# Patient Record
Sex: Male | Born: 1970
Health system: Southern US, Community
[De-identification: ages and names within clinical notes are randomized; demographics above are authoritative.]

## PROBLEM LIST (undated history)

## (undated) DIAGNOSIS — T7840XA Allergy, unspecified, initial encounter: Secondary | ICD-10-CM

## (undated) DIAGNOSIS — D126 Benign neoplasm of colon, unspecified: Secondary | ICD-10-CM

## (undated) DIAGNOSIS — M1990 Unspecified osteoarthritis, unspecified site: Secondary | ICD-10-CM

## (undated) DIAGNOSIS — K824 Cholesterolosis of gallbladder: Secondary | ICD-10-CM

## (undated) DIAGNOSIS — K219 Gastro-esophageal reflux disease without esophagitis: Secondary | ICD-10-CM

## (undated) DIAGNOSIS — E785 Hyperlipidemia, unspecified: Secondary | ICD-10-CM

## (undated) DIAGNOSIS — E559 Vitamin D deficiency, unspecified: Secondary | ICD-10-CM

## (undated) HISTORY — DX: Unspecified osteoarthritis, unspecified site: M19.90

## (undated) HISTORY — DX: Vitamin D deficiency, unspecified: E55.9

## (undated) HISTORY — DX: Hyperlipidemia, unspecified: E78.5

## (undated) HISTORY — DX: Gastro-esophageal reflux disease without esophagitis: K21.9

## (undated) HISTORY — DX: Allergy, unspecified, initial encounter: T78.40XA

## (undated) HISTORY — DX: Benign neoplasm of colon, unspecified: D12.6

## (undated) HISTORY — DX: Cholesterolosis of gallbladder: K82.4

---

## 2011-02-08 ENCOUNTER — Other Ambulatory Visit: Payer: Self-pay | Admitting: Gastroenterology

## 2011-02-08 DIAGNOSIS — R1013 Epigastric pain: Secondary | ICD-10-CM

## 2011-02-21 ENCOUNTER — Ambulatory Visit
Admission: RE | Admit: 2011-02-21 | Discharge: 2011-02-21 | Disposition: A | Discharge status: Home / Self Care | Payer: 59 | Source: Ambulatory Visit | Attending: Gastroenterology | Admitting: Gastroenterology

## 2011-02-21 DIAGNOSIS — R1013 Epigastric pain: Secondary | ICD-10-CM

## 2011-03-14 ENCOUNTER — Ambulatory Visit (INDEPENDENT_AMBULATORY_CARE_PROVIDER_SITE_OTHER): Payer: 59 | Admitting: General Surgery

## 2011-05-07 ENCOUNTER — Ambulatory Visit (INDEPENDENT_AMBULATORY_CARE_PROVIDER_SITE_OTHER): Payer: 59 | Admitting: General Surgery

## 2011-05-07 ENCOUNTER — Encounter (INDEPENDENT_AMBULATORY_CARE_PROVIDER_SITE_OTHER): Payer: Self-pay | Admitting: General Surgery

## 2011-05-07 VITALS — BP 116/84 | HR 72 | Temp 96.9°F | Resp 12 | Ht 68.0 in | Wt 200.0 lb

## 2011-05-07 DIAGNOSIS — K219 Gastro-esophageal reflux disease without esophagitis: Secondary | ICD-10-CM

## 2011-05-07 DIAGNOSIS — K8020 Calculus of gallbladder without cholecystitis without obstruction: Secondary | ICD-10-CM

## 2011-05-07 NOTE — Progress Notes (Signed)
Chief Complaint  Patient presents with  . Other    new pt- eval gallstones or polyps    HPI Lance Warner is a 40 y.o. male.  Referred by Dr Lance Warner HPI [name] who is otherwise healthy who has had some epigastric abdominal pain and burning chest pain for a number of years. He also complains of some pain in his left orbit at the same time. He has some nausea but no vomiting. He occasionally has some hiccups. He has been seen by gastroenterology. He is on Nexium and this helped most of those symptoms although he occasionally does still have some epigastric pain. He associated epigastric pain with his typical reflux pain but he describes it as a knot in his epigastrium. There is no real relation to any food. This is mostly constant when it does occur. He has undergone an EGD that is normal. He also has undergone an ultrasound that shows several small echogenic foci adherent to the gallbladder wall consistent with polyps or hearing stones. The largest of these measured 1 cm at the gallbladder neck. His common bile duct measured 3 mm. He comes in today to discuss a possible laparoscopic cholecystectomy both for possible symptoms as well as a polyp  Past Medical History  Diagnosis Date  . Hyperlipidemia   . GERD (gastroesophageal reflux disease)     History reviewed. No pertinent past surgical history.  History reviewed. No pertinent family history.  Social History History  Substance Use Topics  . Smoking status: Never Smoker   . Smokeless tobacco: Not on file  . Alcohol Use: No    No Known Allergies  Current Outpatient Prescriptions  Medication Sig Dispense Refill  . Cholecalciferol (VITAMIN D PO) Take 1,200 mg by mouth daily.        Marland Kitchen esomeprazole (NEXIUM) 40 MG capsule Take 40 mg by mouth daily before breakfast.          Review of Systems Review of Systems  Blood pressure 116/84, pulse 72, temperature 96.9 F (36.1 C), temperature source Temporal, resp.  rate 12, height 5\' 8"  (1.727 m), weight 200 lb (90.719 kg).  Physical Exam Physical Exam  Constitutional: He appears well-developed and well-nourished.  Eyes: No scleral icterus.  Neck: Neck supple.  Cardiovascular: Normal rate, regular rhythm and normal heart sounds.   Pulmonary/Chest: Effort normal and breath sounds normal. He has no wheezes. He has no rales.  Abdominal: Soft. Normal appearance and bowel sounds are normal. There is no tenderness. There is negative Murphy's sign. No hernia.  Lymphadenopathy:    He has no cervical adenopathy.    Data Reviewed ABDOMINAL ULTRASOUND COMPLETE  Comparison: None.  Findings:  Gallbladder: There are several small echogenic foci adherent to  the gallbladder wall, consistent with polyps or adherent  cholesterol stones. The largest measures approximately 1 cm at the  gallbladder neck. No gallbladder wall thickening or pericholecystic  fluid. Negative sonographic Murphy's sign.  Common Bile Duct: Within normal limits in caliber. Measures 3 mm.  Liver: No focal mass lesion identified. There is marked, diffuse  fatty infiltration.  IVC: Appears normal.  Pancreas: No abnormality identified.  Spleen: Within normal limits in size and echotexture.  Right kidney: Normal in size and parenchymal echogenicity. No  evidence of mass or hydronephrosis.  Left kidney: Normal in size and parenchymal echogenicity. No  evidence of mass or hydronephrosis.  Abdominal Aorta: No aneurysm identified.  IMPRESSION: Diffuse fatty infiltration of the liver.  There are multiple  small gallbladder polyps versus adherent  cholesterol stones within the gallbladder lumen. No evidence of  cholecystitis, however.   Assessment    Symptomatic cholelithiasis vs polyp    Plan    We discussed his symptoms might mostly be secondary to his reflux but the epigastric pain may be gallbladder related.  There is also a concern for this being a polyp.  I recommended a lap chole  due to his symptoms and the presence of a possible polyp.   I discussed the procedure in detail.  The patient was given Agricultural engineer.  We discussed the risks and benefits of a laparoscopic cholecystectomy and possible cholangiogram including, but not limited to bleeding, infection, injury to surrounding structures such as the intestine or liver, bile leak, retained gallstones, need to convert to an open procedure, prolonged diarrhea, blood clots such as  DVT, common bile duct injury, anesthesia risks, and possible need for additional procedures.  The likelihood of improvement in symptoms and return to the patient's normal status is good. We discussed the typical post-operative recovery course.  He would like to consider his options at this point.  I told him if he doesnt decide to undergo surgery we would need to set up a repeat ultrasound.  We also discussed the risks of choledocholithiasis and gallstone pancreatitis.      Lance Warner 05/07/2011, 10:12 AM

## 2011-06-21 ENCOUNTER — Encounter (INDEPENDENT_AMBULATORY_CARE_PROVIDER_SITE_OTHER): Payer: Self-pay | Admitting: Gastroenterology

## 2011-12-25 ENCOUNTER — Telehealth (INDEPENDENT_AMBULATORY_CARE_PROVIDER_SITE_OTHER): Payer: Self-pay

## 2011-12-25 DIAGNOSIS — R109 Unspecified abdominal pain: Secondary | ICD-10-CM

## 2011-12-25 NOTE — Telephone Encounter (Signed)
Called pt's wife about ordering another abd. U/s.  We will schedule the xray and call with appt time. The pt is requesting an August time to get the xray.

## 2012-01-27 ENCOUNTER — Ambulatory Visit
Admission: RE | Admit: 2012-01-27 | Discharge: 2012-01-27 | Disposition: A | Discharge status: Home / Self Care | Payer: 59 | Source: Ambulatory Visit | Attending: General Surgery | Admitting: General Surgery

## 2012-01-27 DIAGNOSIS — R109 Unspecified abdominal pain: Secondary | ICD-10-CM

## 2012-01-28 ENCOUNTER — Telehealth (INDEPENDENT_AMBULATORY_CARE_PROVIDER_SITE_OTHER): Payer: Self-pay

## 2012-01-28 NOTE — Telephone Encounter (Signed)
Pt's wife returned my call. We scheduled the pt for an appt with Dr Dwain Sarna for 8/16 arrive at 8:30 for 8:45am.

## 2012-01-28 NOTE — Telephone Encounter (Signed)
LMOM at home and cell. I need to make f/u appt for pt with Dr Dwain Sarna.

## 2012-02-07 ENCOUNTER — Encounter (INDEPENDENT_AMBULATORY_CARE_PROVIDER_SITE_OTHER): Payer: Self-pay | Admitting: General Surgery

## 2012-02-07 ENCOUNTER — Ambulatory Visit (INDEPENDENT_AMBULATORY_CARE_PROVIDER_SITE_OTHER): Payer: 59 | Admitting: General Surgery

## 2012-02-07 VITALS — BP 122/76 | HR 68 | Temp 97.3°F | Resp 16 | Ht 68.0 in | Wt 189.0 lb

## 2012-02-07 DIAGNOSIS — K824 Cholesterolosis of gallbladder: Secondary | ICD-10-CM

## 2012-02-07 NOTE — Progress Notes (Signed)
Subjective:     Patient ID: Lance Warner, male   DOB: [birth year], 41 y.o.   MRN: [id number]  HPI This is a 41-year-old male who is otherwise healthy who I saw previously with  epigastric abdominal pain and burning chest pain for a number of years. He has been seen by gastroenterology. He is on Nexium and this helped most of those symptoms although he occasionally does still have some epigastric pain. He associated epigastric pain with his typical reflux pain but he describes it as a knot in his epigastrium. There is no real relation to any food. This is mostly constant when it does occur. He has undergone an EGD that is normal. He also has undergone an ultrasound that shows several small echogenic foci adherent to the gallbladder wall consistent with polyps or\stones. The largest of these measured 1 cm at the gallbladder neck. His common bile duct measured 3 mm.I saw him previously and recommended lap chole.  He wanted to observe at that time.  He returns today for followup after we repeated his ultrasound. On this ultrasound he is a single mobile gallstone measuring 5 mm. The gallbladder wall measures 2 mm. He has 2-3 mm gallbladder polyps. He also has hepatic steatosis. Since I last saw him he has been on his Nexium and he has absolutely no abdominal symptoms.   Review of Systems COMPLETE ABDOMINAL ULTRASOUND  Comparison: 02/21/2011.  Findings:  Gallbladder: There is a single mobile gallstone measuring 5 mm.  No wall thickening or pericholecystic fluid. Gallbladder wall  measures 2 mm. No sonographic Murphy's sign.  Common bile duct: 3 mm, normal.  Liver: Echogenic liver compatible with hepatic steatosis. No  focal mass lesions. No intrahepatic biliary ductal dilation.  IVC: Visualized portions normal. Intrahepatic IVC not visualized  due to overlying bowel gas.  Pancreas: Not visualized due to overlying bowel gas.  Spleen: 4.2 cm with normal echotexture.  Right Kidney: 11 cm. Normal  echotexture. Normal central sinus  echo complex. No calculi or hydronephrosis.  Left Kidney: 10.8 cm. Normal echotexture. Normal central sinus  echo complex. No calculi or hydronephrosis.  Abdominal aorta: No aneurysm identified.  IMPRESSION:  1. Cholelithiasis without cholecystitis. Single mobile echogenic  gallstone measures 5 mm.  2. Two 3 mm gallbladder polyps.  3. Echogenic liver compatible with hepatic steatosis.     Objective:   Physical Exam  Vitals reviewed. Constitutional: He appears well-developed.  Abdominal: Soft. Bowel sounds are normal. He exhibits no distension and no mass. There is no tenderness. There is negative Murphy's sign. No hernia.       Assessment:     Gallbladder polyps    Plan:     We reviewed the ultrasound today. His prior symptoms may have been completely related to his reflux. He has absolutely no symptoms on his Nexium. He does have a single gallstone and 2 very small polyps. I told them it would  be reasonable to observe this for right now. We discussed at length symptoms as well. I told him if he has any abdominal symptoms or anything that might be referable to his gallbladder I certainly would recommend removing his gallbladder but just not sure at this point that the gallbladder is the source of any of the symptoms that he had previously. I told him if he chooses to observe this I will plan on seeing him back in one year and repeat ultrasound just to look at his polyps one more time.

## 2012-06-22 ENCOUNTER — Ambulatory Visit: Payer: 59 | Attending: Family Medicine

## 2012-06-22 DIAGNOSIS — IMO0001 Reserved for inherently not codable concepts without codable children: Secondary | ICD-10-CM | POA: Insufficient documentation

## 2012-06-22 DIAGNOSIS — M546 Pain in thoracic spine: Secondary | ICD-10-CM | POA: Insufficient documentation

## 2012-06-25 ENCOUNTER — Ambulatory Visit: Payer: 59 | Attending: Family Medicine | Admitting: Physical Therapy

## 2012-06-25 DIAGNOSIS — IMO0001 Reserved for inherently not codable concepts without codable children: Secondary | ICD-10-CM | POA: Insufficient documentation

## 2012-06-25 DIAGNOSIS — M546 Pain in thoracic spine: Secondary | ICD-10-CM | POA: Insufficient documentation

## 2012-06-30 ENCOUNTER — Ambulatory Visit: Payer: 59

## 2012-09-09 ENCOUNTER — Other Ambulatory Visit: Payer: Self-pay | Admitting: *Deleted

## 2012-09-10 MED ORDER — KETOCONAZOLE 2 % EX CREA: TOPICAL_CREAM | CUTANEOUS | Status: DC

## 2012-09-10 NOTE — Telephone Encounter (Signed)
refilled 

## 2012-12-16 ENCOUNTER — Encounter (INDEPENDENT_AMBULATORY_CARE_PROVIDER_SITE_OTHER): Payer: Self-pay | Admitting: General Surgery

## 2013-01-26 ENCOUNTER — Telehealth (INDEPENDENT_AMBULATORY_CARE_PROVIDER_SITE_OTHER): Payer: Self-pay | Admitting: General Surgery

## 2013-01-26 DIAGNOSIS — K824 Cholesterolosis of gallbladder: Secondary | ICD-10-CM

## 2013-01-26 NOTE — Telephone Encounter (Signed)
Spoke with pt's wife and explained that I had her husband set up with [facility] at [address] on 02/03/13 @ 8:30.  Informed her that he must be NPO after midnight.  Also gave him the f/u appt w/ Dr. Dwain Sarna on 02/08/13 at 11:45 w/ arrival time of 11:30.

## 2013-01-26 NOTE — Telephone Encounter (Signed)
Message copied by Littie Deeds on Tue Jan 26, 2013  2:06 PM ------      Message from: [place], [place]      Created: Tue Jan 26, 2013  1:54 PM       That is fine.            ----- Message -----         From: Littie Deeds         Sent: 01/26/2013  10:04 AM           To: Emelia Loron, MD            Pt called in explaining that they need a LTF appt w/ you for GB.  They insisted on having another [place] before the appt.  Your noet says that you will get one on him but I wanted to get the okay from you to have them do this before their appt.            Penni Bombard       ------

## 2013-02-03 ENCOUNTER — Ambulatory Visit
Admission: RE | Admit: 2013-02-03 | Discharge: 2013-02-03 | Disposition: A | Discharge status: Home / Self Care | Payer: 59 | Source: Ambulatory Visit | Attending: General Surgery | Admitting: General Surgery

## 2013-02-03 DIAGNOSIS — K824 Cholesterolosis of gallbladder: Secondary | ICD-10-CM

## 2013-02-08 ENCOUNTER — Ambulatory Visit (INDEPENDENT_AMBULATORY_CARE_PROVIDER_SITE_OTHER): Payer: 59 | Admitting: General Surgery

## 2013-02-08 ENCOUNTER — Encounter (INDEPENDENT_AMBULATORY_CARE_PROVIDER_SITE_OTHER): Payer: Self-pay | Admitting: General Surgery

## 2013-02-08 VITALS — BP 112/64 | HR 64 | Temp 96.1°F | Resp 14 | Ht 68.0 in | Wt 194.2 lb

## 2013-02-08 DIAGNOSIS — K824 Cholesterolosis of gallbladder: Secondary | ICD-10-CM

## 2013-02-08 NOTE — Patient Instructions (Signed)

## 2013-02-11 NOTE — Progress Notes (Signed)
Subjective:     Patient ID: Lance Warner, male   DOB: [birth year], 42 y.o.   MRN: [id number]  HPI This is a 42 year-old male who is otherwise healthy who I saw previously with epigastric abdominal pain and burning chest pain for a number of years.  He is on Nexium which he takes as needed.  This completely takes care of any symptoms he has.  These are rare now and associated with eating fatty foods like pizza. He otherwise describes no abdominal pain, nausea or any other GI symptoms. He has undergone an EGD that is normal. I previously thought some symptoms might be related to gallstones and had discussed lap chole.  I think now that he really does not have any gb symptoms and it pretty clearly is associated with his reflux which is better now.  He really has no complaints today.  He has undergone another u/s to follow polyps and returns today to discuss this.     Review of Systems COMPLETE ABDOMINAL ULTRASOUND  Comparison: 01/27/2012  Findings:  Gallbladder: Mobile 5 mm echogenic focus within the gallbladder.  No definite posterior acoustic shadowing.  2 non mobile 3 mm gallbladder polyps. No wall thickening or  pericholecystic fluid. Sonographic Murphy's sign was not elicited.  Common bile duct: Normal, 3 mm.  Liver: Mildly heterogeneous and hyperechoic, consistent with  steatosis.  IVC: Partially obscured by bowel gas.  Pancreas: Poorly visualized due to overlying bowel gas.  Spleen: Normal in size and echogenicity.  Right Kidney: 11.0 cm. No hydronephrosis.  Left Kidney: 10.8 cm. No hydronephrosis.  Abdominal aorta: Nonaneurysmal without ascites.  IMPRESSION:  1. Cholelithiasis without cholecystitis.  2. Similar appearance of 2 gallbladder polyps.  3. Hepatic steatosis.  4. Portions of anatomy obscured by overlying bowel gas.      Objective:   Physical Exam  Vitals reviewed. Constitutional: He appears well-developed and well-nourished.  Abdominal: Normal appearance and bowel  sounds are normal. He exhibits no distension. There is no tenderness. There is negative Murphy's sign.       Assessment:     GERD Gallstone, 3 mm polyps (asymptomatic)    Plan:     We reviewed the ultrasound today again. There have been no concerning changes.  The polyps are still small and unchanged. His prior symptoms may have been completely related to his reflux. He has absolutely no symptoms on his Nexium that he uses PRN. He does have a single gallstone and 2 very small polyps. I told them it would be reasonable to observe this for right now. We discussed at length symptoms as well. I told him if he has any abdominal symptoms or anything that might be referable to his gallbladder I certainly would recommend removing his gallbladder but just not sure at this point that the gallbladder is the source of any of the symptoms that he had previously. I told him at this point to call me back if develops any symptoms.

## 2013-06-15 ENCOUNTER — Encounter (INDEPENDENT_AMBULATORY_CARE_PROVIDER_SITE_OTHER): Payer: Self-pay

## 2013-06-15 ENCOUNTER — Encounter (INDEPENDENT_AMBULATORY_CARE_PROVIDER_SITE_OTHER): Payer: 59 | Admitting: Family Medicine

## 2013-06-15 NOTE — Progress Notes (Signed)
Patient ID:  Rescheduled. Not seen

## 2013-07-05 ENCOUNTER — Encounter: Payer: Self-pay | Admitting: Family Medicine

## 2013-07-05 ENCOUNTER — Ambulatory Visit (INDEPENDENT_AMBULATORY_CARE_PROVIDER_SITE_OTHER): Payer: 59 | Admitting: Family Medicine

## 2013-07-05 VITALS — BP 115/80 | HR 68 | Temp 97.3°F | Ht 67.5 in | Wt 193.4 lb

## 2013-07-05 DIAGNOSIS — M1990 Unspecified osteoarthritis, unspecified site: Secondary | ICD-10-CM | POA: Insufficient documentation

## 2013-07-05 DIAGNOSIS — K219 Gastro-esophageal reflux disease without esophagitis: Secondary | ICD-10-CM

## 2013-07-05 DIAGNOSIS — Z125 Encounter for screening for malignant neoplasm of prostate: Secondary | ICD-10-CM

## 2013-07-05 DIAGNOSIS — Z0000 Encounter for general adult medical examination without abnormal findings: Secondary | ICD-10-CM | POA: Insufficient documentation

## 2013-07-05 DIAGNOSIS — E785 Hyperlipidemia, unspecified: Secondary | ICD-10-CM

## 2013-07-05 DIAGNOSIS — Z119 Encounter for screening for infectious and parasitic diseases, unspecified: Secondary | ICD-10-CM

## 2013-07-05 DIAGNOSIS — E559 Vitamin D deficiency, unspecified: Secondary | ICD-10-CM | POA: Insufficient documentation

## 2013-07-05 NOTE — Patient Instructions (Addendum)
HEALTH MAINTENANCE Immunizations: Tetanus-Diphtheria Booster [id number]:2022 Pertusis Booster [phone]:2022 Flu Shot Due: every Fall Pneumonia Vaccine: usually at 8965 years of age unless there are certain risk situations. Herpes Zoster/Shingles Vaccine due: usually at 41 years of age HPV MWN:UUVOdue:from age 309 to 1526 years in males and females.  Healthy Life Habits: Exercise Goal: 5-6 days/week; start gradually(ie 30 minutes/3days per week) Nutrition: Balanced healthy meals including Vegetables and Fruits. Consider  Reading the following books: 1) Eat to Live by Dr Ottis StainJoel Furhman; 2) Prevent and Reverse Heart Disease by Dr Suzzette Righteraldwell Esselstyn.  Vitamins:OKAY to take a multivitamin Aspirin:81 mg daily Stop Tobacco Use:n/a Seat Belt Use:+++ recommended Sunscreen Use:+++ recommended  Recommended Screening Tests: Colon Cancer Screening: at age 41 years Blood work: today Cholesterol Screening:    today       HIV:  n/a                Hepatitis C(people born 341945-1963): today EKG: stable  Monthly Self Testicular Exam:+++  Eye Exam: every 1 to 2 years recommended Dental Health: at least every 6 months  Others:    Living Will/Healthcare Power of Attorney: should have this in order with your personal estate planning

## 2013-07-05 NOTE — Progress Notes (Signed)
Patient ID: Lance Warner, male   DOB: Nov 30, 1970, 43 y.o.   MRN: [id number] SUBJECTIVE: CC: Chief Complaint  Patient presents with  . Annual Exam    c/o pain left arm area and pain in left flank pain pain in ankle and knee after riding bike 10 milescomes and goes    HPI: Aches and pains when he exercises or does sports. Right knee, left ankle. Got a used bicycle. Road [address]. Doesn't know how to adjust. He describes the position of sitting and he does need to have the bike adjusted for him. He is stressing his knees and ankles.  Has been out of atorvastatin. He is taking the Vit D.  Only exercises 2 x per week. Plays  Soccer.  Otherwise doing very well.    Past Medical History  Diagnosis Date  . Hyperlipidemia   . GERD (gastroesophageal reflux disease)    No past surgical history on file. History   Social History  . Marital Status: Married    Spouse Name: N/A    Number of Children: N/A  . Years of Education: N/A   Occupational History  . Not on file.   Social History Main Topics  . Smoking status: Never Smoker   . Smokeless tobacco: Never Used  . Alcohol Use: No  . Drug Use: No  . Sexual Activity: Not on file   Other Topics Concern  . Not on file   Social History Narrative  . No narrative on file   No family history on file. Current Outpatient Prescriptions on File Prior to Visit  Medication Sig Dispense Refill  . esomeprazole (NEXIUM) 40 MG capsule Take 40 mg by mouth daily before breakfast.        . ketoconazole (NIZORAL) 2 % cream Apply topically twice a day to rash for 6 weeks  60 g  2   No current facility-administered medications on file prior to visit.   No Known Allergies Immunization History  Administered Date(s) Administered  . Tdap 04/25/2011   Prior to Admission medications   Medication Sig Start Date End Date Taking? Authorizing Provider  esomeprazole (NEXIUM) 40 MG capsule Take 40 mg by mouth daily before breakfast.      Historical  Provider, MD  ketoconazole (NIZORAL) 2 % cream Apply topically twice a day to rash for 6 weeks 09/09/12   Lance Shanks, MD     ROS: As above in the HPI. All other systems are stable or negative.  OBJECTIVE: APPEARANCE:  Patient in no acute distress.The patient appeared well nourished and normally developed. Acyanotic. Waist: VITAL SIGNS:BP 115/80  Pulse 68  Temp(Src) 97.3 F (36.3 C) (Oral)  Ht 5' 7.5" (1.715 m)  Wt 193 lb 6.4 oz (87.726 kg)  BMI 29.83 kg/m2 African Male   SKIN: warm and  Dry without overt rashes, tattoos and scars  HEAD and Neck: without JVD, Head and scalp: normal Eyes:No scleral icterus. Fundi normal, eye movements normal. Ears: Auricle normal, canal normal, Tympanic membranes normal, insufflation normal. Nose: normal Throat: normal Neck & thyroid: normal  CHEST & LUNGS: Chest wall: normal Lungs: Clear  CVS: Reveals the PMI to be normally located. Regular rhythm, First and Second Heart sounds are normal,  absence of murmurs, rubs or gallops. Peripheral vasculature: Radial pulses: normal Dorsal pedis pulses: normal Posterior pulses: normal  ABDOMEN:  Appearance: overweight Benign, no organomegaly, no masses, no Abdominal Aortic enlargement. No Guarding , no rebound. No Bruits. Bowel sounds: normal  RECTAL: N/A GU: N/A  EXTREMETIES: nonedematous.  MUSCULOSKELETAL:  Spine: normal Joints: crepitus of the right knee  NEUROLOGIC: oriented to time,place and person; nonfocal. Strength is normal Sensory is normal Reflexes are normal Cranial Nerves are normal.  ASSESSMENT:   Physical exam, annual - Plan: EKG 12-Lead  GERD (gastroesophageal reflux disease)  Screening for prostate cancer - Plan: PSA, total and free  Screening examination for infectious disease - Plan: Hepatitis C antibody  HLD (hyperlipidemia) - Plan: CMP14+EGFR, NMR, lipoprofile  Unspecified vitamin D deficiency - Plan: Vit D  25 hydroxy (rtn osteoporosis  monitoring)  PLAN:      HEALTH MAINTENANCE Immunizations: Tetanus-Diphtheria Booster [id number] Pertusis Booster [phone] Flu Shot Due: every Fall Pneumonia Vaccine: usually at 40 years of age unless there are certain risk situations. Herpes Zoster/Shingles Vaccine due: usually at 41 years of age HPV XYV:OPFY age 58 to 31 years in males and females.  Healthy Life Habits: Exercise Goal: 5-6 days/week; start gradually(ie 30 minutes/3days per week) Nutrition: Balanced healthy meals including Vegetables and Fruits. Consider  Reading the following books: 1) Eat to Live by Dr Diona Browner; 2) Prevent and Reverse Heart Disease by Dr Karl Luke.  Vitamins:OKAY to take a multivitamin Aspirin:81 mg daily Stop Tobacco Use:n/a Seat Belt Use:+++ recommended Sunscreen Use:+++ recommended  Recommended Screening Tests: Colon Cancer Screening: at age 66 years Blood work: today Cholesterol Screening:    today       HIV:  n/a                Hepatitis C(people born 26-1963): today EKG: stable  Monthly Self Testicular Exam:+++  Eye Exam: every 1 to 2 years recommended Dental Health: at least every 6 months  Others:    Living Will/Healthcare Power of Attorney: should have this in order with your personal estate planning    Wellness discussed Orders Placed This Encounter  Procedures  . CMP14+EGFR  . NMR, lipoprofile  . PSA, total and free  . Hepatitis C antibody  . Vit D  25 hydroxy (rtn osteoporosis monitoring)  . EKG 12-Lead   Meds ordered this encounter  Medications  . Cholecalciferol (VITAMIN D) 2000 UNITS CAPS    Sig: Take 2,000 Units by mouth daily.   There are no discontinued medications. Return pending labwork.  Lance Warner, M.D.

## 2013-07-07 LAB — NMR, LIPOPROFILE
Cholesterol: 227 mg/dL — ABNORMAL HIGH (ref <200)
HDL Cholesterol by NMR: 46 mg/dL (ref >=40)
HDL Particle Number: 32 umol/L (ref >=30.5)
LDL Particle Number: 1792 nmol/L — ABNORMAL HIGH (ref <1000)
LDL Size: 21.3 nm (ref >20.5)
LDLC SERPL CALC-MCNC: 154 mg/dL — ABNORMAL HIGH (ref <100)
LP-IR Score: 65 — ABNORMAL HIGH (ref <=45)
Small LDL Particle Number: 565 nmol/L — ABNORMAL HIGH (ref <=527)
Triglycerides by NMR: 134 mg/dL (ref <150)

## 2013-07-07 LAB — PSA, TOTAL AND FREE
PSA, Free Pct: 44.5 %
PSA, Free: 0.89 ng/mL
PSA: 2 ng/mL (ref 0.0–4.0)

## 2013-07-07 LAB — CMP14+EGFR
ALT: 21 IU/L (ref 0–44)
AST: 20 IU/L (ref 0–40)
Albumin/Globulin Ratio: 1.8 (ref 1.1–2.5)
Albumin: 4.7 g/dL (ref 3.5–5.5)
Alkaline Phosphatase: 115 IU/L (ref 39–117)
BUN/Creatinine Ratio: 11 (ref 9–20)
BUN: 15 mg/dL (ref 6–24)
CO2: 25 mmol/L (ref 18–29)
Calcium: 10.1 mg/dL (ref 8.7–10.2)
Chloride: 97 mmol/L (ref 97–108)
Creatinine, Ser: 1.38 mg/dL — ABNORMAL HIGH (ref 0.76–1.27)
GFR calc Af Amer: 73 mL/min/{1.73_m2} (ref >59)
GFR calc non Af Amer: 63 mL/min/{1.73_m2} (ref >59)
Globulin, Total: 2.6 g/dL (ref 1.5–4.5)
Glucose: 97 mg/dL (ref 65–99)
Potassium: 4.7 mmol/L (ref 3.5–5.2)
Sodium: 140 mmol/L (ref 134–144)
Total Bilirubin: 0.5 mg/dL (ref 0.0–1.2)
Total Protein: 7.3 g/dL (ref 6.0–8.5)

## 2013-07-07 LAB — VITAMIN D 25 HYDROXY (VIT D DEFICIENCY, FRACTURES): Vit D, 25-Hydroxy: 19.6 ng/mL — ABNORMAL LOW (ref 30.0–100.0)

## 2013-07-07 LAB — HEPATITIS C ANTIBODY: Hep C Virus Ab: <0.1 s/co ratio (ref 0.0–0.9)

## 2013-07-08 NOTE — Progress Notes (Signed)
Quick Note:  Call Patient Labs that are abnormal: Creatinine elevated Lipids are abnormal Vitamin D is low  The rest are at goal  Recommendations: Needs an appointment with me to discuss results and to schedule further work up of the kidneys. Will need to treat the vit D def and the lipids.   ______

## 2013-07-12 ENCOUNTER — Telehealth: Payer: Self-pay | Admitting: Family Medicine

## 2013-07-12 NOTE — Telephone Encounter (Signed)
Message copied by Azalee CourseFULP, ASHLEY on Mon Jul 12, 2013  4:00 PM ------      Message from: Ileana LaddWONG, FRANCIS P      Created: Thu Jul 08, 2013 10:06 PM       Call Patient      Labs that are abnormal:      Creatinine elevated      Lipids are abnormal      Vitamin D is low            The rest are at goal            Recommendations:      Needs an appointment with me to discuss results and to schedule further work up of the kidneys.      Will need to treat the vit D def and the lipids.             ------

## 2013-07-13 NOTE — Telephone Encounter (Signed)
Patient aware.

## 2013-07-15 ENCOUNTER — Ambulatory Visit (INDEPENDENT_AMBULATORY_CARE_PROVIDER_SITE_OTHER): Payer: 59 | Admitting: Family Medicine

## 2013-07-15 ENCOUNTER — Encounter: Payer: Self-pay | Admitting: Family Medicine

## 2013-07-15 ENCOUNTER — Encounter (INDEPENDENT_AMBULATORY_CARE_PROVIDER_SITE_OTHER): Payer: Self-pay

## 2013-07-15 VITALS — BP 109/72 | HR 64 | Temp 98.0°F | Ht 67.5 in | Wt 196.0 lb

## 2013-07-15 DIAGNOSIS — E785 Hyperlipidemia, unspecified: Secondary | ICD-10-CM

## 2013-07-15 DIAGNOSIS — R799 Abnormal finding of blood chemistry, unspecified: Secondary | ICD-10-CM

## 2013-07-15 DIAGNOSIS — K219 Gastro-esophageal reflux disease without esophagitis: Secondary | ICD-10-CM

## 2013-07-15 DIAGNOSIS — M1990 Unspecified osteoarthritis, unspecified site: Secondary | ICD-10-CM

## 2013-07-15 DIAGNOSIS — E559 Vitamin D deficiency, unspecified: Secondary | ICD-10-CM

## 2013-07-15 DIAGNOSIS — M129 Arthropathy, unspecified: Secondary | ICD-10-CM

## 2013-07-15 DIAGNOSIS — R7989 Other specified abnormal findings of blood chemistry: Secondary | ICD-10-CM | POA: Insufficient documentation

## 2013-07-15 LAB — POCT URINALYSIS DIPSTICK
Bilirubin, UA: NEGATIVE
Blood, UA: NEGATIVE
Glucose, UA: NEGATIVE
Ketones, UA: NEGATIVE
Leukocytes, UA: NEGATIVE
Nitrite, UA: NEGATIVE
Protein, UA: NEGATIVE
Spec Grav, UA: 1.01
Urobilinogen, UA: NEGATIVE
pH, UA: 7.5

## 2013-07-15 LAB — POCT UA - MICROSCOPIC ONLY
Bacteria, U Microscopic: NEGATIVE
Casts, Ur, LPF, POC: NEGATIVE
Crystals, Ur, HPF, POC: NEGATIVE
Mucus, UA: NEGATIVE
RBC, urine, microscopic: NEGATIVE
WBC, Ur, HPF, POC: NEGATIVE
Yeast, UA: NEGATIVE

## 2013-07-15 LAB — POCT UA - MICROALBUMIN: Microalbumin Ur, POC: NEGATIVE mg/L

## 2013-07-15 MED ORDER — VITAMIN D 50 MCG (2000 UT) PO CAPS: 4000.0000 [IU] | ORAL_CAPSULE | Freq: Every day | ORAL | Status: AC

## 2013-07-15 NOTE — Patient Instructions (Addendum)
cereals that are good is  Shredded wheat. Old fashion oatmeal, or steel cut oatmeal.      Dr Woodroe ModeFrancis Ilian Wessell's Recommendations  For nutrition information, I recommend books:  1).Eat to Live by Dr Monico HoarJoel Fuhrman. 2).Prevent and Reverse Heart Disease by Dr Suzzette Righteraldwell Esselstyn. 3) Dr Katherina RightNeal Barnard's Book:  Program to Reverse Diabetes  Exercise recommendations are:  If unable to walk, then the patient can exercise in a chair 3 times a day. By flapping arms like a bird gently and raising legs outwards to the front.  If ambulatory, the patient can go for walks for 30 minutes 3 times a week. Then increase the intensity and duration as tolerated.  Goal is to try to attain exercise frequency to 5 times a week.  If applicable: Best to perform resistance exercises (machines or weights) 2 days a week and cardio type exercises 3 days per week.

## 2013-07-15 NOTE — Progress Notes (Signed)
Patient ID: Lance Warner, male   DOB: March 29, 1971, 43 y.o.   MRN: [id number] SUBJECTIVE: CC: Chief Complaint  Patient presents with  . Follow-up    go over labs /tests    HPI:  Breakfast: doesn't usually. Will have a green smoothie in the morning if he is [place] Lunch: chicken, left overs, gizzard, chuck roast,etc. With brown rice mixed with broccoli and green beans . Dinner: sometimes depends : sometimes veggies and  Salads or left overs. Lunch is the biggest meal with a lot of meat.  Here for follow up of elevated creatinine and low Vit D  And hyperlipidemia.  Past Medical History  Diagnosis Date  . Hyperlipidemia   . GERD (gastroesophageal reflux disease)   . Arthritis     right knee.  . Polyp of gallbladder   . Vitamin D deficiency    History reviewed. No pertinent past surgical history. History   Social History  . Marital Status: Married    Spouse Name: N/A    Number of Children: N/A  . Years of Education: N/A   Occupational History  . Not on file.   Social History Main Topics  . Smoking status: Never Smoker   . Smokeless tobacco: Never Used  . Alcohol Use: No  . Drug Use: No  . Sexual Activity: Not on file   Other Topics Concern  . Not on file   Social History Narrative  . No narrative on file   Family History  Problem Relation Age of Onset  . Peripheral vascular disease Mother   . Hypertension Mother   . Vision loss Father   . Cancer Sister   . Migraines Sister     states died from aneyrysm   Current Outpatient Prescriptions on File Prior to Visit  Medication Sig Dispense Refill  . esomeprazole (NEXIUM) 40 MG capsule Take 40 mg by mouth daily before breakfast.        . ketoconazole (NIZORAL) 2 % cream Apply topically twice a day to rash for 6 weeks  60 g  2   No current facility-administered medications on file prior to visit.   No Known Allergies Immunization History  Administered Date(s) Administered  . Tdap 04/25/2011   Prior to  Admission medications   Medication Sig Start Date End Date Taking? Authorizing Provider  Cholecalciferol (VITAMIN D) 2000 UNITS CAPS Take 2,000 Units by mouth daily.    Historical Provider, MD  esomeprazole (NEXIUM) 40 MG capsule Take 40 mg by mouth daily before breakfast.      Historical Provider, MD  ketoconazole (NIZORAL) 2 % cream Apply topically twice a day to rash for 6 weeks 09/09/12   Vernie Shanks, MD     ROS: As above in the HPI. All other systems are stable or negative.  OBJECTIVE: APPEARANCE:  Patient in no acute distress.The patient appeared well nourished and normally developed. Acyanotic. Waist: VITAL SIGNS:BP 109/72  Pulse 64  Temp(Src) 98 F (36.7 C) (Oral)  Ht 5' 7.5" (1.715 m)  Wt 196 lb (88.905 kg)  BMI 30.23 kg/m2  African Male  SKIN: warm and  Dry without overt rashes, tattoos and scars  HEAD and Neck: without JVD, Head and scalp: normal Eyes:No scleral icterus. Fundi normal, eye movements normal. Ears: Auricle normal, canal normal, Tympanic membranes normal, insufflation normal. Nose: normal Throat: normal Neck & thyroid: normal  CHEST & LUNGS: Chest wall: normal Lungs: Clear  CVS: Reveals the PMI to be normally located. Regular rhythm, First and Second Heart  sounds are normal,  absence of murmurs, rubs or gallops. Peripheral vasculature: Radial pulses: normal Dorsal pedis pulses: normal Posterior pulses: normal  ABDOMEN:  Appearance: Obese Benign, no organomegaly, no masses, no Abdominal Aortic enlargement. No Guarding , no rebound. No Bruits. Bowel sounds: normal  RECTAL: N/A GU: N/A  EXTREMETIES: nonedematous.  NEUROLOGIC: oriented to time,place and person; nonfocal. Strength is normal Sensory is normal Reflexes are normal Cranial Nerves are normal.  Results for orders placed in visit on 07/15/13  POCT URINALYSIS DIPSTICK      Result Value Range   Color, UA yellow     Clarity, UA cleat     Glucose, UA neg     Bilirubin,  UA neg     Ketones, UA neg     Spec Grav, UA 1.010     Blood, UA neg     pH, UA 7.5     Protein, UA neg     Urobilinogen, UA negative     Nitrite, UA neg     Leukocytes, UA Negative    POCT UA - MICROSCOPIC ONLY      Result Value Range   WBC, Ur, HPF, POC neg     RBC, urine, microscopic neg     Bacteria, U Microscopic neg     Mucus, UA neg     Epithelial cells, urine per micros occ     Crystals, Ur, HPF, POC neg     Casts, Ur, LPF, POC neg     Yeast, UA neg    POCT UA - MICROALBUMIN      Result Value Range   Microalbumin Ur, POC neg      ASSESSMENT: GERD (gastroesophageal reflux disease)  Unspecified vitamin D deficiency - Plan: Cholecalciferol (VITAMIN D) 2000 UNITS CAPS  Arthritis  HLD (hyperlipidemia)  Elevated serum creatinine - Plan: Creatinine Clearance, Urine, 24 Hour, BMP8+EGFR, POCT urinalysis dipstick, POCT UA - Microscopic Only, POCT UA - Microalbumin, US Abdomen Limited  PLAN:  Orders Placed This Encounter  Procedures  . US Abdomen Limited    Standing Status: Future     Number of Occurrences:      Standing Expiration Date: 09/13/2014    Order Specific Question:  Reason for Exam (SYMPTOM  OR DIAGNOSIS REQUIRED)    Answer:  elevated creatinine    Order Specific Question:  Preferred imaging location?    Answer:  [facility]  . Creatinine Clearance, Urine, 24 Hour    Standing Status: Future     Number of Occurrences:      Standing Expiration Date: 07/15/2014  . BMP8+EGFR    Standing Status: Future     Number of Occurrences:      Standing Expiration Date: 07/15/2014    Order Specific Question:  Has the patient fasted?    Answer:  No  . POCT urinalysis dipstick  . POCT UA - Microscopic Only  . POCT UA - Microalbumin   Meds ordered this encounter  Medications  . Cholecalciferol (VITAMIN D) 2000 UNITS CAPS    Sig: Take 2 capsules (4,000 Units total) by mouth daily.    Dispense:  60 capsule    Refill:  11   Medications Discontinued  During This Encounter  Medication Reason  . Cholecalciferol (VITAMIN D) 2000 UNITS CAPS Reorder  spent 40 minutes reviewing labs with patient.  Return in about 3 weeks (around 08/05/2013) for Recheck medical problems.  Lilibeth Opie P. Jacelyn Grip, M.D.

## 2013-07-15 NOTE — Progress Notes (Signed)
Quick Note:  Call patient. Labs normal so far. No change in plan.await 24 hour urine analysis. ______

## 2013-07-19 ENCOUNTER — Other Ambulatory Visit: Payer: Self-pay | Admitting: Family Medicine

## 2013-07-19 ENCOUNTER — Other Ambulatory Visit (INDEPENDENT_AMBULATORY_CARE_PROVIDER_SITE_OTHER): Payer: 59

## 2013-07-19 DIAGNOSIS — R7989 Other specified abnormal findings of blood chemistry: Secondary | ICD-10-CM

## 2013-07-19 DIAGNOSIS — R799 Abnormal finding of blood chemistry, unspecified: Secondary | ICD-10-CM

## 2013-07-19 NOTE — Addendum Note (Signed)
Addended by: Orma RenderHODGES, Jaggar Benko F on: 07/19/2013 02:50 PM   Modules accepted: Orders

## 2013-07-20 LAB — BMP8+EGFR
BUN/Creatinine Ratio: 9 (ref 9–20)
BUN: 12 mg/dL (ref 6–24)
CO2: 23 mmol/L (ref 18–29)
Calcium: 9.8 mg/dL (ref 8.7–10.2)
Chloride: 97 mmol/L (ref 97–108)
Creatinine, Ser: 1.39 mg/dL — ABNORMAL HIGH (ref 0.76–1.27)
GFR calc Af Amer: 73 mL/min/{1.73_m2} (ref >59)
GFR calc non Af Amer: 63 mL/min/{1.73_m2} (ref >59)
Glucose: 91 mg/dL (ref 65–99)
Potassium: 4.6 mmol/L (ref 3.5–5.2)
Sodium: 141 mmol/L (ref 134–144)

## 2013-07-22 ENCOUNTER — Other Ambulatory Visit: Payer: Self-pay | Admitting: Family Medicine

## 2013-07-22 ENCOUNTER — Ambulatory Visit
Admission: RE | Admit: 2013-07-22 | Discharge: 2013-07-22 | Disposition: A | Discharge status: Home / Self Care | Payer: 59 | Source: Ambulatory Visit | Attending: Family Medicine | Admitting: Family Medicine

## 2013-07-22 DIAGNOSIS — R7989 Other specified abnormal findings of blood chemistry: Secondary | ICD-10-CM

## 2013-07-22 LAB — CREATININE, URINE, 24 HOUR
Creatinine, 24H Ur: 1911 mg/d (ref 800–2000)
Creatinine, Urine: 65.9 mg/dL

## 2013-07-22 NOTE — Progress Notes (Signed)
Quick Note:  Call patient. U/S of kidneys : normal. No change in plan. ______

## 2013-07-23 ENCOUNTER — Telehealth: Payer: Self-pay | Admitting: Family Medicine

## 2013-07-23 NOTE — Telephone Encounter (Signed)
Message copied by Azalee CourseFULP, Kemyah Buser on Fri Jul 23, 2013 10:26 AM ------      Message from: Ileana LaddWONG, FRANCIS P      Created: Thu Jul 22, 2013  7:43 PM       Call patient.      U/S of kidneys : normal.      No change in plan. ------

## 2013-07-23 NOTE — Telephone Encounter (Signed)
Patients wife aware

## 2013-07-26 ENCOUNTER — Telehealth: Payer: Self-pay | Admitting: Family Medicine

## 2013-07-26 NOTE — Telephone Encounter (Signed)
Left message for pt to return call.

## 2013-07-26 NOTE — Telephone Encounter (Signed)
Lab results given and informed we are waiting on the 24 hr creatinine clearance results

## 2013-08-12 ENCOUNTER — Ambulatory Visit (INDEPENDENT_AMBULATORY_CARE_PROVIDER_SITE_OTHER): Payer: 59 | Admitting: Family Medicine

## 2013-08-12 ENCOUNTER — Ambulatory Visit (INDEPENDENT_AMBULATORY_CARE_PROVIDER_SITE_OTHER): Payer: 59

## 2013-08-12 ENCOUNTER — Encounter: Payer: Self-pay | Admitting: Family Medicine

## 2013-08-12 VITALS — BP 116/77 | HR 56 | Temp 98.2°F | Ht 68.0 in | Wt 195.0 lb

## 2013-08-12 DIAGNOSIS — M5414 Radiculopathy, thoracic region: Secondary | ICD-10-CM

## 2013-08-12 DIAGNOSIS — R7989 Other specified abnormal findings of blood chemistry: Secondary | ICD-10-CM

## 2013-08-12 DIAGNOSIS — R799 Abnormal finding of blood chemistry, unspecified: Secondary | ICD-10-CM

## 2013-08-12 DIAGNOSIS — E559 Vitamin D deficiency, unspecified: Secondary | ICD-10-CM

## 2013-08-12 DIAGNOSIS — E785 Hyperlipidemia, unspecified: Secondary | ICD-10-CM

## 2013-08-12 DIAGNOSIS — IMO0002 Reserved for concepts with insufficient information to code with codable children: Secondary | ICD-10-CM

## 2013-08-12 DIAGNOSIS — K219 Gastro-esophageal reflux disease without esophagitis: Secondary | ICD-10-CM

## 2013-08-12 NOTE — Progress Notes (Signed)
Patient ID: Lance Warner, male   DOB: 1971-03-30, 43 y.o.   MRN: [id number] SUBJECTIVE: CC: Chief Complaint  Patient presents with  . Follow-up    4 week follow up states continues to have pain left axiallae area left lower side and c/o legs pain. states  pain comes and goes and left lower side constant pain. states exercisies every day and swims 2 days  a week for 1 hour     HPI: 1) still has the pain in the left chest wall. Starts around T4 and felt in the front of the chest in the 4 th intercostal space. Also feels it going down the inside of the left arm and forearm. No weakness.  Work up has included a stress test which was normal. A gall bladder U/S which showed a polyp. Also had chest Xrays. Only a wedge in the lower thoracic vertebrae. He feels better if he sleeps on his right side with a pillow under his chest. However with exercise and movement the pain comes and goes.  2) elevated creatinine.: his UA , was normal, his 24 hour urine collection was good and his BMP is unchanged. Unfortunately his 24 hour urine creatinine clearance was not calculated because of lab issues.  3) Patient is here for follow up of hyperlipidemia: denies Headache;denies Chest Pain;denies weakness;denies Shortness of Breath and orthopnea;denies Visual changes;denies palpitations;denies cough;denies pedal edema;denies symptoms of TIA or stroke;deniesClaudication symptoms. admits to Compliance with medications; denies Problems with medications.  4) follow up of GERD: stable  5) Vit D Def: on his Vitamin D   Past Medical History  Diagnosis Date  . Hyperlipidemia   . GERD (gastroesophageal reflux disease)   . Arthritis     right knee.  . Polyp of gallbladder   . Vitamin D deficiency    No past surgical history on file. History   Social History  . Marital Status: Married    Spouse Name: N/A    Number of Children: N/A  . Years of Education: N/A   Occupational History  . Not on file.    Social History Main Topics  . Smoking status: Never Smoker   . Smokeless tobacco: Never Used  . Alcohol Use: No  . Drug Use: No  . Sexual Activity: Not on file   Other Topics Concern  . Not on file   Social History Narrative  . No narrative on file   Family History  Problem Relation Age of Onset  . Peripheral vascular disease Mother   . Hypertension Mother   . Vision loss Father   . Cancer Sister   . Migraines Sister     states died from [facility]   Current Outpatient Prescriptions on File Prior to Visit  Medication Sig Dispense Refill  . Cholecalciferol (VITAMIN D) 2000 UNITS CAPS Take 2 capsules (4,000 Units total) by mouth daily.  60 capsule  11  . esomeprazole (NEXIUM) 40 MG capsule Take 40 mg by mouth daily before breakfast.         No current facility-administered medications on file prior to visit.   No Known Allergies Immunization History  Administered Date(s) Administered  . Tdap 04/25/2011   Prior to Admission medications   Medication Sig Start Date End Date Taking? Authorizing Provider  Cholecalciferol (VITAMIN D) 2000 UNITS CAPS Take 2 capsules (4,000 Units total) by mouth daily. 07/15/13  Yes Vernie Shanks, MD  esomeprazole (NEXIUM) 40 MG capsule Take 40 mg by mouth daily before breakfast.  Yes Historical Provider, MD     ROS: As above in the HPI. All other systems are stable or negative.  OBJECTIVE: APPEARANCE:  Patient in no acute distress.The patient appeared well nourished and normally developed. Acyanotic. Waist: VITAL SIGNS:BP 116/77  Pulse 56  Temp(Src) 98.2 F (36.8 C) (Oral)  Ht 5' 8"  (1.727 m)  Wt 195 lb (88.451 kg)  BMI 29.66 kg/m2  Overweight African Male.  SKIN: warm and  Dry without overt rashes, tattoos and scars  HEAD and Neck: without JVD, Head and scalp: normal Eyes:No scleral icterus. Fundi normal, eye movements normal. Ears: Auricle normal, canal normal, Tympanic membranes normal, insufflation normal. Nose:  normal Throat: normal Neck & thyroid: normal  CHEST & LUNGS: Chest wall: normal Lungs: Clear  CVS: Reveals the PMI to be normally located. Regular rhythm, First and Second Heart sounds are normal,  absence of murmurs, rubs or gallops. Peripheral vasculature: Radial pulses: normal Dorsal pedis pulses: normal Posterior pulses: normal  ABDOMEN:  Appearance: normal Benign, no organomegaly, no masses, no Abdominal Aortic enlargement. No Guarding , no rebound. No Bruits. Bowel sounds: normal  RECTAL: N/A GU: N/A  EXTREMETIES: nonedematous.  MUSCULOSKELETAL:  Spine: normal ROM however on deep palpation at T3 level pain is reproduced in the upper back on the right. Joints: intact  NEUROLOGIC: oriented to time,place and person; nonfocal. Strength is normal Sensory is normal Reflexes are normal Cranial Nerves are normal. Results for orders placed in visit on 07/19/13  [facility]      Result Value Ref Range   Glucose 91  65 - 99 mg/dL   BUN 12  6 - 24 mg/dL   Creatinine, Ser 1.39 (*) 0.76 - 1.27 mg/dL   GFR calc non Af Amer 63  >59 mL/min/1.73   GFR calc Af Amer 73  >59 mL/min/1.73   BUN/Creatinine Ratio 9  9 - 20   Sodium 141  134 - 144 mmol/L   Potassium 4.6  3.5 - 5.2 mmol/L   Chloride 97  97 - 108 mmol/L   CO2 23  18 - 29 mmol/L   Calcium 9.8  8.7 - 10.2 mg/dL    ASSESSMENT:  HLD (hyperlipidemia)  Elevated serum creatinine  Unspecified vitamin D deficiency  GERD (gastroesophageal reflux disease)  Thoracic radiculopathy - Plan: DG Thoracic Spine 2 View, CANCELED: DG Thoracic Spine W/Swimmers  PLAN: Reviewed labs with patient. Orders Placed This Encounter  Procedures  . DG Thoracic Spine 2 View    Standing Status: Future     Number of Occurrences: 1     Standing Expiration Date: 10/11/2014    Order Specific Question:  Reason for Exam (SYMPTOM  OR DIAGNOSIS REQUIRED)    Answer:  T3 thoracic radiculopathy    Order Specific Question:  Preferred imaging  location?    Answer:  Internal  [facility] reading (PRIMARY) by  Dr.Kareen Hitsman: cervical degenerative changes. Thoracic spine, mild wedging in lower thoracic vertebrae.                                  Cause of chest wall not found. Will consult with orthopedics to see what they think.  No orders of the defined types were placed in this encounter.   Medications Discontinued During This Encounter  Medication Reason  . ketoconazole (NIZORAL) 2 % cream Completed Course   No Follow-up on file.  Havanah Nelms P. Jacelyn Grip, M.D.

## 2016-06-17 ENCOUNTER — Encounter (HOSPITAL_BASED_OUTPATIENT_CLINIC_OR_DEPARTMENT_OTHER): Payer: Self-pay | Admitting: Emergency Medicine

## 2016-06-17 ENCOUNTER — Emergency Department (HOSPITAL_BASED_OUTPATIENT_CLINIC_OR_DEPARTMENT_OTHER)
Admission: EM | Admit: 2016-06-17 | Discharge: 2016-06-17 | Disposition: A | Discharge status: Home / Self Care | Payer: 59 | Attending: Emergency Medicine | Admitting: Emergency Medicine

## 2016-06-17 DIAGNOSIS — Z79899 Other long term (current) drug therapy: Secondary | ICD-10-CM | POA: Insufficient documentation

## 2016-06-17 DIAGNOSIS — N12 Tubulo-interstitial nephritis, not specified as acute or chronic: Secondary | ICD-10-CM | POA: Diagnosis not present

## 2016-06-17 DIAGNOSIS — R300 Dysuria: Secondary | ICD-10-CM | POA: Diagnosis present

## 2016-06-17 LAB — CBC WITH DIFFERENTIAL/PLATELET
Basophils Absolute: 0 10*3/uL (ref 0.0–0.1)
Basophils Relative: 0 %
EOS PCT: 0 %
Eosinophils Absolute: 0 10*3/uL (ref 0.0–0.7)
HCT: 46.8 % (ref 39.0–52.0)
Hemoglobin: 16.3 g/dL (ref 13.0–17.0)
LYMPHS ABS: 1.9 10*3/uL (ref 0.7–4.0)
LYMPHS PCT: 13 %
MCH: 27.1 pg (ref 26.0–34.0)
MCHC: 34.8 g/dL (ref 30.0–36.0)
MCV: 77.7 fL — AB (ref 78.0–100.0)
MONOS PCT: 7 %
Monocytes Absolute: 1 10*3/uL (ref 0.1–1.0)
Neutro Abs: 11.5 10*3/uL — ABNORMAL HIGH (ref 1.7–7.7)
Neutrophils Relative %: 80 %
PLATELETS: 397 10*3/uL (ref 150–400)
RBC: 6.02 MIL/uL — AB (ref 4.22–5.81)
RDW: 16.9 % — ABNORMAL HIGH (ref 11.5–15.5)
WBC: 14.5 10*3/uL — AB (ref 4.0–10.5)

## 2016-06-17 LAB — COMPREHENSIVE METABOLIC PANEL
ALBUMIN: 4 g/dL (ref 3.5–5.0)
ALK PHOS: 78 U/L (ref 38–126)
ALT: 40 U/L (ref 17–63)
ANION GAP: 9 (ref 5–15)
AST: 31 U/L (ref 15–41)
BUN: 15 mg/dL (ref 6–20)
CALCIUM: 9.5 mg/dL (ref 8.9–10.3)
CHLORIDE: 104 mmol/L (ref 101–111)
CO2: 25 mmol/L (ref 22–32)
Creatinine, Ser: 1.44 mg/dL — ABNORMAL HIGH (ref 0.61–1.24)
GFR calc non Af Amer: 58 mL/min — ABNORMAL LOW (ref >60)
GLUCOSE: 112 mg/dL — AB (ref 65–99)
Potassium: 3.9 mmol/L (ref 3.5–5.1)
SODIUM: 138 mmol/L (ref 135–145)
Total Bilirubin: 0.9 mg/dL (ref 0.3–1.2)
Total Protein: 7.2 g/dL (ref 6.5–8.1)

## 2016-06-17 LAB — URINALYSIS, MICROSCOPIC (REFLEX)

## 2016-06-17 LAB — URINALYSIS, ROUTINE W REFLEX MICROSCOPIC
Bilirubin Urine: NEGATIVE
GLUCOSE, UA: NEGATIVE mg/dL
Ketones, ur: NEGATIVE mg/dL
Nitrite: NEGATIVE
PROTEIN: NEGATIVE mg/dL
SPECIFIC GRAVITY, URINE: 1.006 (ref 1.005–1.030)
pH: 5.5 (ref 5.0–8.0)

## 2016-06-17 LAB — I-STAT CG4 LACTIC ACID, ED
Lactic Acid, Venous: 2.45 mmol/L (ref 0.5–1.9)
Lactic Acid, Venous: 1.78 mmol/L (ref 0.5–1.9)

## 2016-06-17 MED ORDER — CEPHALEXIN 500 MG PO CAPS: 500.0000 mg | ORAL_CAPSULE | Freq: Three times a day (TID) | ORAL | 0 refills | Status: AC

## 2016-06-17 MED ORDER — ONDANSETRON 4 MG PO TBDP: 4.0000 mg | ORAL_TABLET | Freq: Three times a day (TID) | ORAL | 0 refills | Status: DC | PRN

## 2016-06-17 MED ORDER — ACETAMINOPHEN 500 MG PO TABS
1000.0000 mg | ORAL_TABLET | Freq: Once | ORAL | Status: AC
  Administered 2016-06-17: 1000 mg via ORAL

## 2016-06-17 MED ORDER — SODIUM CHLORIDE 0.9 % IV BOLUS (SEPSIS)
1000.0000 mL | Freq: Once | INTRAVENOUS | Status: AC
  Administered 2016-06-17: 1000 mL via INTRAVENOUS

## 2016-06-17 MED ORDER — ACETAMINOPHEN 500 MG PO TABS
ORAL_TABLET | ORAL | Status: AC
  Filled 2016-06-17: qty 2

## 2016-06-17 MED ORDER — DEXTROSE 5 % IV SOLN
1.0000 g | Freq: Once | INTRAVENOUS | Status: AC
  Administered 2016-06-17: 1 g via INTRAVENOUS
  Filled 2016-06-17: qty 10

## 2016-06-17 MED ORDER — IBUPROFEN 400 MG PO TABS
400.0000 mg | ORAL_TABLET | Freq: Once | ORAL | Status: AC
  Administered 2016-06-17: 400 mg via ORAL
  Filled 2016-06-17: qty 1

## 2016-06-17 MED ORDER — PHENAZOPYRIDINE HCL 200 MG PO TABS: 200.0000 mg | ORAL_TABLET | Freq: Three times a day (TID) | ORAL | 0 refills | Status: DC

## 2016-06-17 NOTE — ED Provider Notes (Signed)
[facility] Provider Note   CSN: [id number] Arrival date & time: 06/17/16  1006     History   Chief Complaint Chief Complaint  Patient presents with  . Dysuria    HPI Lance Warner is a 45 y.o. male.  HPI   Pain with urination and body aches started yesterday This morning had fever No vomiting but did have some nausea No abdominal nor flank pain  No cough, no congestion   Past Medical History:  Diagnosis Date  . Arthritis    right knee.  Marland Kitchen. GERD (gastroesophageal reflux disease)   . Hyperlipidemia   . Polyp of gallbladder   . Vitamin D deficiency     Patient Active Problem List   Diagnosis Date Noted  . Thoracic radiculopathy 08/12/2013  . Elevated serum creatinine 07/15/2013  . Physical exam, annual 07/05/2013  . Screening for prostate cancer 07/05/2013  . Screening examination for infectious disease 07/05/2013  . HLD (hyperlipidemia) 07/05/2013  . Unspecified vitamin D deficiency 07/05/2013  . Arthritis   . GERD (gastroesophageal reflux disease) 05/07/2011    History reviewed. No pertinent surgical history.     Home Medications    Prior to Admission medications   Medication Sig Start Date End Date Taking? Authorizing Provider  cephALEXin (KEFLEX) 500 MG capsule Take 1 capsule (500 mg total) by mouth 3 (three) times daily. 06/17/16 07/01/16  Alvira MondayErin Charleton Deyoung, MD  Cholecalciferol (VITAMIN D) 2000 UNITS CAPS Take 2 capsules (4,000 Units total) by mouth daily. 07/15/13   Ileana LaddFrancis P Wong, MD  esomeprazole (NEXIUM) 40 MG capsule Take 40 mg by mouth daily before breakfast.      Historical Provider, MD  ondansetron (ZOFRAN ODT) 4 MG disintegrating tablet Take 1 tablet (4 mg total) by mouth every 8 (eight) hours as needed for nausea or vomiting. 06/17/16   Alvira MondayErin Beatrix Breece, MD  phenazopyridine (PYRIDIUM) 200 MG tablet Take 1 tablet (200 mg total) by mouth 3 (three) times daily. 06/17/16   Alvira MondayErin Martina Brodbeck, MD    Family History Family History    Problem Relation Age of Onset  . Peripheral vascular disease Mother   . Hypertension Mother   . Vision loss Father   . Cancer Sister   . Migraines Sister     states died from aneyrysm    Social History Social History  Substance Use Topics  . Smoking status: Never Smoker  . Smokeless tobacco: Never Used  . Alcohol use No     Allergies   Patient has no known allergies.   Review of Systems Review of Systems  Constitutional: Positive for chills and fever.  HENT: Negative for congestion, sore throat and trouble swallowing.   Eyes: Negative for visual disturbance.  Respiratory: Negative for cough and shortness of breath.   Cardiovascular: Negative for chest pain.  Gastrointestinal: Positive for abdominal pain (suprapubic "pressure") and nausea. Negative for constipation, diarrhea and vomiting.  Genitourinary: Positive for dysuria. Negative for difficulty urinating, discharge and flank pain.  Musculoskeletal: Negative for back pain and neck stiffness.  Skin: Negative for rash.  Neurological: Negative for syncope and headaches.     Physical Exam Updated Vital Signs BP 120/84   Pulse 104   Temp 100.8 F (38.2 C) (Oral)   Resp 21   Ht 5\' 8"  (1.727 m)   Wt 200 lb (90.7 kg)   SpO2 98%   BMI 30.41 kg/m   Physical Exam  Constitutional: He is oriented to person, place, and time. He appears well-developed and well-nourished. No  distress.  chills  HENT:  Head: Normocephalic and atraumatic.  Eyes: Conjunctivae and EOM are normal.  Neck: Normal range of motion.  Cardiovascular: Regular rhythm, normal heart sounds and intact distal pulses.  Tachycardia present.  Exam reveals no gallop and no friction rub.   No murmur heard. Pulmonary/Chest: Effort normal and breath sounds normal. No respiratory distress. He has no wheezes. He has no rales.  Abdominal: Soft. He exhibits no distension. There is tenderness (suprapubic, mild). There is no guarding.  No CVA tenderness   Musculoskeletal: He exhibits no edema.  Neurological: He is alert and oriented to person, place, and time.  Skin: Skin is warm and dry. He is not diaphoretic.  Nursing note and vitals reviewed.    ED Treatments / Results  Labs (all labs ordered are listed, but only abnormal results are displayed) Labs Reviewed  CBC WITH DIFFERENTIAL/PLATELET - Abnormal; Notable for the following:       Result Value   WBC 14.5 (*)    RBC 6.02 (*)    MCV 77.7 (*)    RDW 16.9 (*)    Neutro Abs 11.5 (*)    All other components within normal limits  URINALYSIS, ROUTINE W REFLEX MICROSCOPIC - Abnormal; Notable for the following:    Hgb urine dipstick SMALL (*)    Leukocytes, UA LARGE (*)    All other components within normal limits  URINALYSIS, MICROSCOPIC (REFLEX) - Abnormal; Notable for the following:    Bacteria, UA FEW (*)    Squamous Epithelial / LPF 0-5 (*)    All other components within normal limits  COMPREHENSIVE METABOLIC PANEL - Abnormal; Notable for the following:    Glucose, Bld 112 (*)    Creatinine, Ser 1.44 (*)    GFR calc non Af Amer 58 (*)    All other components within normal limits  I-STAT CG4 LACTIC ACID, ED - Abnormal; Notable for the following:    Lactic Acid, Venous 2.45 (*)    All other components within normal limits  URINE CULTURE  CULTURE, BLOOD (ROUTINE X 2)  CULTURE, BLOOD (ROUTINE X 2)  I-STAT CG4 LACTIC ACID, ED    EKG  EKG Interpretation None       Radiology No results found.  Procedures Procedures (including critical care time)  Medications Ordered in ED Medications  sodium chloride 0.9 % bolus 1,000 mL (0 mLs Intravenous Stopped 06/17/16 1142)  sodium chloride 0.9 % bolus 1,000 mL (0 mLs Intravenous Stopped 06/17/16 1426)  cefTRIAXone (ROCEPHIN) 1 g in dextrose 5 % 50 mL IVPB (0 g Intravenous Stopped 06/17/16 1301)  acetaminophen (TYLENOL) tablet 1,000 mg (1,000 mg Oral Given 06/17/16 1039)  ibuprofen (ADVIL,MOTRIN) tablet 400 mg (400 mg Oral  Given 06/17/16 1401)     Initial Impression / Assessment and Plan / ED Course  I have reviewed the triage vital signs and the nursing notes.  Pertinent labs & imaging results that were available during my care of the patient were reviewed by me and considered in my medical decision making (see chart for details).  Clinical Course    45 year old male with no significant medical history presents with concern for dysuria, fever and chills. Urinalysis shows large leukocytes, in setting of patient's symptoms, feel most likely etiology of his fever is UTI. He does not have cough or other source of fever. Abdominal exam benign, doubt appendicitis. Patient febrile and tachycardic on arrival to the emergency department with elevated lactic acid of 2.45. He is given 2 L  of normal saline, Tylenol, and Rocephin.  After fluids, pt with HR in 90s, lactic acid rechecked and 1.78.  Renal function at baseline. Pt well appearing, without other medical problems, appropriate for trial of outpatient management.  Given rx for pyridium, keflex x2wk, zofran.  Patient discharged in stable condition with understanding of reasons to return.   Final Clinical Impressions(s) / ED Diagnoses   Final diagnoses:  Pyelonephritis    New Prescriptions Discharge Medication List as of 06/17/2016  2:21 PM    START taking these medications   Details  cephALEXin (KEFLEX) 500 MG capsule Take 1 capsule (500 mg total) by mouth 3 (three) times daily., Starting Mon 06/17/2016, Until Mon 07/01/2016, Print    ondansetron (ZOFRAN ODT) 4 MG disintegrating tablet Take 1 tablet (4 mg total) by mouth every 8 (eight) hours as needed for nausea or vomiting., Starting Mon 06/17/2016, Print    phenazopyridine (PYRIDIUM) 200 MG tablet Take 1 tablet (200 mg total) by mouth 3 (three) times daily., Starting Mon 06/17/2016, Print         Alvira Monday, MD 06/17/16 [phone]

## 2016-06-19 LAB — URINE CULTURE: Culture: >=100000 — AB

## 2016-06-20 ENCOUNTER — Telehealth (HOSPITAL_BASED_OUTPATIENT_CLINIC_OR_DEPARTMENT_OTHER): Payer: Self-pay

## 2016-06-20 NOTE — Progress Notes (Signed)
ED Antimicrobial Stewardship Positive Culture Follow Up  Dredyn Balogh is an 45 y.o. male who [facility] with a chief complaint of dysuria concern for UTI.   Chief Complaint  Patient presents with  . Dysuria   ? Recent Results (from the past 720 hour(s))  Urine culture     Status: Abnormal   Collection Time: 06/17/16 10:20 AM  Result Value Ref Range Status   Specimen Description URINE, RANDOM  Final   Special Requests NONE  Final   Culture (A)  Final    >=100,000 COLONIES/mL ESCHERICHIA COLI Confirmed Extended Spectrum Beta-Lactamase Producer (ESBL) Performed at [facility]    Report Status 06/19/2016 FINAL  Final   Organism ID, Bacteria ESCHERICHIA COLI (A)  Final      Susceptibility   Escherichia coli - MIC*    AMPICILLIN >=32 RESISTANT Resistant     CEFAZOLIN >=64 RESISTANT Resistant     CEFTRIAXONE 32 RESISTANT Resistant     CIPROFLOXACIN >=4 RESISTANT Resistant     GENTAMICIN <=1 SENSITIVE Sensitive     IMIPENEM <=0.25 SENSITIVE Sensitive     NITROFURANTOIN 64 INTERMEDIATE Intermediate     TRIMETH/SULFA >=320 RESISTANT Resistant     AMPICILLIN/SULBACTAM 4 SENSITIVE Sensitive     PIP/TAZO <=4 SENSITIVE Sensitive     Extended ESBL POSITIVE Resistant     * >=100,000 COLONIES/mL ESCHERICHIA COLI  Blood culture (routine x 2)     Status: None (Preliminary result)   Collection Time: 06/17/16 11:20 AM  Result Value Ref Range Status   Specimen Description BLOOD LEFT AC  Final   Special Requests BOTTLES DRAWN AEROBIC AND ANAEROBIC 5CC EACH  Final   Culture   Final    NO GROWTH 2 DAYS [facility]formed at [facility]    Report Status PENDING  Incomplete  Blood culture (routine x 2)     Status: None (Preliminary result)   Collection Time: 06/17/16 11:24 AM  Result Value Ref Range Status   Specimen [facility]  Final   Special Requests BOTTLES DRAWN AEROBIC AND ANAEROBIC 3CC EACH  Final   Culture   Final    NO GROWTH 2  DAYS Performed at [facility]    Report Status PENDING  Incomplete   ? Treated with Cephalexin, organism resistant to prescribed antimicrobial  ? New antibiotic prescription: Fosfomycin tromethamine (Monurol) 3 gram packet every 48 hours x 3 doses  ? ED Provider: Trixie DredgeEmily West PA- C ? Sheron NightingaleJames A Lindsay Straka 06/20/2016, 9:40 AM Infectious Diseases Pharmacist Phone# [phone]

## 2016-06-20 NOTE — Telephone Encounter (Signed)
Post ED Visit - Positive Culture Follow-up: Successful Patient Follow-Up  Culture assessed and recommendations reviewed by: []  Enzo BiNathan Batchelder, Pharm.D. []  Celedonio MiyamotoJeremy Frens, Pharm.D., BCPS []  Garvin FilaMike Maccia, Pharm.D. []  Georgina PillionElizabeth Martin, Pharm.D., BCPS []  SopertonMinh Pham, [address] Rainbow BoulevardPharm.D., BCPS, AAHIVP []  Estella HuskMichelle Turner, Pharm.D., BCPS, AAHIVP []  Tennis Mustassie Stewart, Pharm.D. []  Sherle Poeob Vincent, [place]D.  Positive urine culture, >/= 100,000 colonies -> E Coli (ESBL)  []  Patient discharged without antimicrobial prescription and treatment is now indicated [x]  Organism is resistant to prescribed ED discharge antimicrobial(Cephalexin) []  Patient with positive blood cultures  Changes discussed with ED provider: Trixie DredgeEmily West PA New antibiotic prescription "FosFomycin 3 grams every 48 hrs x 3 days" Called to [facility] [phone] [facility]  Contacted patient, date 06/20/16, time 14:09  Pts wife informed(with husbands permission).  Pts wife advised to call prior to picking up as often has to be ordered.   Arvid RightClark, Giannis Corpuz Dorn 06/20/2016, 2:08 PM

## 2016-06-22 LAB — CULTURE, BLOOD (ROUTINE X 2)
CULTURE: NO GROWTH
Culture: NO GROWTH

## 2016-07-30 DIAGNOSIS — Z87440 Personal history of urinary (tract) infections: Secondary | ICD-10-CM | POA: Diagnosis not present

## 2016-09-16 DIAGNOSIS — E7800 Pure hypercholesterolemia, unspecified: Secondary | ICD-10-CM | POA: Diagnosis not present

## 2016-09-16 DIAGNOSIS — E559 Vitamin D deficiency, unspecified: Secondary | ICD-10-CM | POA: Diagnosis not present

## 2016-09-16 DIAGNOSIS — R748 Abnormal levels of other serum enzymes: Secondary | ICD-10-CM | POA: Diagnosis not present

## 2016-10-04 DIAGNOSIS — E7800 Pure hypercholesterolemia, unspecified: Secondary | ICD-10-CM | POA: Diagnosis not present

## 2016-10-04 DIAGNOSIS — R7303 Prediabetes: Secondary | ICD-10-CM | POA: Diagnosis not present

## 2016-11-15 DIAGNOSIS — R748 Abnormal levels of other serum enzymes: Secondary | ICD-10-CM | POA: Diagnosis not present

## 2016-11-15 DIAGNOSIS — R7303 Prediabetes: Secondary | ICD-10-CM | POA: Diagnosis not present

## 2016-11-15 DIAGNOSIS — E7800 Pure hypercholesterolemia, unspecified: Secondary | ICD-10-CM | POA: Diagnosis not present

## 2016-11-15 DIAGNOSIS — R7989 Other specified abnormal findings of blood chemistry: Secondary | ICD-10-CM | POA: Diagnosis not present

## 2017-01-06 ENCOUNTER — Encounter: Payer: 59 | Attending: Family Medicine | Admitting: Registered"

## 2017-01-06 DIAGNOSIS — R7303 Prediabetes: Secondary | ICD-10-CM | POA: Insufficient documentation

## 2017-01-06 DIAGNOSIS — Z713 Dietary counseling and surveillance: Secondary | ICD-10-CM | POA: Diagnosis not present

## 2017-01-06 DIAGNOSIS — E669 Obesity, unspecified: Secondary | ICD-10-CM | POA: Insufficient documentation

## 2017-01-06 DIAGNOSIS — E7800 Pure hypercholesterolemia, unspecified: Secondary | ICD-10-CM | POA: Insufficient documentation

## 2017-01-06 NOTE — Progress Notes (Signed)
Medical Nutrition Therapy:  Appt start time: 0800 end time:  0900.   Assessment:  Primary concerns today: Pt states he is here for pre-diabetes education. (A1c per chart 6.2 on 10/04/16) Pt reports having made some changes such as cutting out chicken and steak and eating more fish. Pt states he read one of the books that Dr. Modesto CharonWong suggested. Pt states that his traditional food ([place] is a little difficult to fit into the book's recommendations  Pt states he doesn't eat breakfast because it feels like his dinner needs more time to work through his system. Pt reports occasional constipation and will drink magnesium citrate laxative to treat. Pt also reports GERD symptoms.  Per referral labs, patient is Vit D deficient (17.7 L). Patient states he does not take his vitamin D everyday.  Preferred Learning Style:   No preference indicated   Learning Readiness:   Change in progress  MEDICATIONS: reviewed   DIETARY INTAKE:  Everyday foods include rice.  Avoided foods include pork & apples (allergy).    24-hr recall:  B ( AM): coffee or tea with sugar  Snk ( AM): none  L (12 PM): bread, tea english w sugar, rice ~2+ cups, fish, fries Snk ( PM): none D ( PM): rice, fries chicken wings (OR Nshmia a dish made from corn) Snk ( PM): none Beverages: tea (green tea and English), coffee, water  Usual physical activity: Soccer 2 x/week and walks 2-5 mi 2x week, used to ride bike 18 miles.  Estimated energy needs: 2200 calories 248 g carbohydrates 165 g protein 61 g fat  Progress Towards Goal(s):  In progress.   Nutritional Diagnosis:  NI-5.8.2 Excessive carbohydrate intake As related to diet rich in rice.  As evidenced by diet recall and elevated A1c of 6.2 per chart.    Intervention:  Nutrition Education. Pathology of diabetes, definition of Type 1 and Type 2, Macronutrients role and importance of balance. Sources of carbohydrates. Role of exercise in BG control. Role of vitamin D in  energy and bone health.  Plan: Aim to take your vitamin D daily Consider drinking your tea a little less hot Consider eating a handful or two of peanuts or other type of nuts every day. Pistachios can help bring down cholesterol. More non-starchy vegetables and less rice (1 c) Consider having beans regularly and watch the serving size.  Consider having some protein with morning coffee (Coffee may be adding to your acid reflux) Great work on the physical activity!  Teaching Method Utilized:  Visual Auditory  Handouts given during visit include:  Living Well with Diabetes (copies from)  My Plate  Carb counting sheet  Barriers to learning/adherence to lifestyle change: none  Demonstrated degree of understanding via:  Teach Back   Monitoring/Evaluation:  Dietary intake, exercise, A1c and body weight prn.

## 2017-01-06 NOTE — Patient Instructions (Addendum)
Aim to take your vitamin D daily Consider drinking your tea a little less hot Consider eating a handful or two of peanuts or other type of nuts every day. Pistachios can help bring down cholesterol. More non-starchy vegetables and less rice (1 c) Consider having beans regularly and watch the serving size.  Consider having some protein with morning coffee (Coffee may be adding to your acid reflux) Great work on the physical activity!

## 2017-01-17 DIAGNOSIS — E7800 Pure hypercholesterolemia, unspecified: Secondary | ICD-10-CM | POA: Diagnosis not present

## 2017-01-17 DIAGNOSIS — R7303 Prediabetes: Secondary | ICD-10-CM | POA: Diagnosis not present

## 2017-01-17 DIAGNOSIS — R748 Abnormal levels of other serum enzymes: Secondary | ICD-10-CM | POA: Diagnosis not present

## 2017-05-19 DIAGNOSIS — R748 Abnormal levels of other serum enzymes: Secondary | ICD-10-CM | POA: Diagnosis not present

## 2017-05-19 DIAGNOSIS — Z125 Encounter for screening for malignant neoplasm of prostate: Secondary | ICD-10-CM | POA: Diagnosis not present

## 2017-05-19 DIAGNOSIS — R7303 Prediabetes: Secondary | ICD-10-CM | POA: Diagnosis not present

## 2017-05-19 DIAGNOSIS — E7800 Pure hypercholesterolemia, unspecified: Secondary | ICD-10-CM | POA: Diagnosis not present

## 2017-05-19 DIAGNOSIS — E559 Vitamin D deficiency, unspecified: Secondary | ICD-10-CM | POA: Diagnosis not present

## 2017-05-19 DIAGNOSIS — Z0000 Encounter for general adult medical examination without abnormal findings: Secondary | ICD-10-CM | POA: Diagnosis not present

## 2017-08-05 DIAGNOSIS — L42 Pityriasis rosea: Secondary | ICD-10-CM | POA: Diagnosis not present

## 2017-08-11 DIAGNOSIS — L42 Pityriasis rosea: Secondary | ICD-10-CM | POA: Diagnosis not present

## 2017-08-18 DIAGNOSIS — L42 Pityriasis rosea: Secondary | ICD-10-CM | POA: Diagnosis not present

## 2017-09-14 DIAGNOSIS — M545 Low back pain: Secondary | ICD-10-CM | POA: Diagnosis not present

## 2017-09-27 DIAGNOSIS — M545 Low back pain: Secondary | ICD-10-CM | POA: Diagnosis not present

## 2017-09-27 DIAGNOSIS — N390 Urinary tract infection, site not specified: Secondary | ICD-10-CM | POA: Diagnosis not present

## 2017-10-01 ENCOUNTER — Other Ambulatory Visit: Payer: Self-pay | Admitting: Family Medicine

## 2017-10-01 DIAGNOSIS — K824 Cholesterolosis of gallbladder: Secondary | ICD-10-CM

## 2017-10-05 ENCOUNTER — Encounter (HOSPITAL_BASED_OUTPATIENT_CLINIC_OR_DEPARTMENT_OTHER): Payer: Self-pay | Admitting: *Deleted

## 2017-10-05 ENCOUNTER — Emergency Department (HOSPITAL_BASED_OUTPATIENT_CLINIC_OR_DEPARTMENT_OTHER)
Admission: EM | Admit: 2017-10-05 | Discharge: 2017-10-05 | Disposition: A | Discharge status: Home / Self Care | Payer: 59 | Attending: Emergency Medicine | Admitting: Emergency Medicine

## 2017-10-05 ENCOUNTER — Other Ambulatory Visit: Payer: Self-pay

## 2017-10-05 ENCOUNTER — Emergency Department (HOSPITAL_BASED_OUTPATIENT_CLINIC_OR_DEPARTMENT_OTHER): Payer: 59

## 2017-10-05 DIAGNOSIS — M545 Low back pain, unspecified: Secondary | ICD-10-CM

## 2017-10-05 DIAGNOSIS — Z79899 Other long term (current) drug therapy: Secondary | ICD-10-CM | POA: Diagnosis not present

## 2017-10-05 LAB — URINALYSIS, MICROSCOPIC (REFLEX)
RBC / HPF: NONE SEEN RBC/hpf (ref 0–5)
SQUAMOUS EPITHELIAL / LPF: NONE SEEN

## 2017-10-05 LAB — URINALYSIS, ROUTINE W REFLEX MICROSCOPIC
BILIRUBIN URINE: NEGATIVE
GLUCOSE, UA: NEGATIVE mg/dL
HGB URINE DIPSTICK: NEGATIVE
KETONES UR: NEGATIVE mg/dL
Nitrite: NEGATIVE
PROTEIN: NEGATIVE mg/dL
Specific Gravity, Urine: <1.005 — ABNORMAL LOW (ref 1.005–1.030)
pH: 6 (ref 5.0–8.0)

## 2017-10-05 MED ORDER — METHOCARBAMOL 500 MG PO TABS: 500.0000 mg | ORAL_TABLET | Freq: Two times a day (BID) | ORAL | 0 refills | Status: DC

## 2017-10-05 NOTE — ED Triage Notes (Signed)
Increased pain with movement-especially from sitting to standing position.

## 2017-10-05 NOTE — ED Provider Notes (Signed)
[facility] EMERGENCY DEPARTMENT Provider Note   CSN: [id number] Arrival date & time: 10/05/17  1628     History   Chief Complaint Chief Complaint  Patient presents with  . Back Pain    HPI Lance Warner is a 47 y.o. male who presents for evaluation of 3 weeks of lower back pain.  Patient reports that back pain initially began after he was out mowing the lawn for several hours.  Patient reports pain is worse with movement.  He denies any preceding trauma, injury, fall.  He has been able ambulate without any difficulty.  Patient reports he has not taken anything for the pain.  Patient reports that 2 days later, he started having a pain to the suprapubic region.  He states that the 2 pains are not connected and that the back pain does not radiate into the flank or the side of his abdomen.  Patient reports that he has not had any difficulty urinating, dysuria, hematuria.  Patient states that he initially went to urgent care and they did a urinalysis which was negative.  Patient reports he is probably go to the ED for further evaluation.  Patient denies any fevers, chest pain, difficulty breathing, abdominal pain, numbness/weakness of his extremities, bowel incontinence, urinary incontinence, saddle anesthesia.  The history is provided by the patient.    Past Medical History:  Diagnosis Date  . Arthritis    right knee.  Marland Kitchen. GERD (gastroesophageal reflux disease)   . Hyperlipidemia   . Polyp of gallbladder   . Vitamin D deficiency     Patient Active Problem List   Diagnosis Date Noted  . Thoracic radiculopathy 08/12/2013  . Elevated serum creatinine 07/15/2013  . Physical exam, annual 07/05/2013  . Screening for prostate cancer 07/05/2013  . Screening examination for infectious disease 07/05/2013  . HLD (hyperlipidemia) 07/05/2013  . Unspecified vitamin D deficiency 07/05/2013  . Arthritis   . GERD (gastroesophageal reflux disease) 05/07/2011    History reviewed. No  pertinent surgical history.      Home Medications    Prior to Admission medications   Medication Sig Start Date End Date Taking? Authorizing Provider  esomeprazole (NEXIUM) 40 MG capsule Take 40 mg by mouth daily before breakfast.     Yes [provider]  ondansetron (ZOFRAN ODT) 4 MG disintegrating tablet Take 1 tablet (4 mg total) by mouth every 8 (eight) hours as needed for nausea or vomiting. 06/17/16  Yes Alvira MondaySchlossman, Erin, MD  phenazopyridine (PYRIDIUM) 200 MG tablet Take 1 tablet (200 mg total) by mouth 3 (three) times daily. 06/17/16  Yes Alvira MondaySchlossman, Erin, MD  Cholecalciferol (VITAMIN D) 2000 UNITS CAPS Take 2 capsules (4,000 Units total) by mouth daily. 07/15/13   Ileana LaddWong, Francis P, MD  methocarbamol (ROBAXIN) 500 MG tablet Take 1 tablet (500 mg total) by mouth 2 (two) times daily. 10/05/17   Maxwell CaulLayden, Najae Rathert A, PA-C  pantoprazole (PROTONIX) 40 MG tablet Take 40 mg by mouth daily.    [provider]    Family History Family History  Problem Relation Age of Onset  . Peripheral vascular disease Mother   . Hypertension Mother   . Vision loss Father   . Cancer Sister   . Migraines Sister        states died from aneyrysm    Social History Social History   Tobacco Use  . Smoking status: Never Smoker  . Smokeless tobacco: Never Used  Substance Use Topics  . Alcohol use: No  .  Drug use: No     Allergies   Pork-derived products   Review of Systems Review of Systems  Constitutional: Negative for chills and fever.  HENT: Negative for congestion.   Eyes: Negative for visual disturbance.  Respiratory: Negative for cough and shortness of breath.   Cardiovascular: Negative for chest pain.  Gastrointestinal: Positive for abdominal pain (suprapubic). Negative for diarrhea, nausea and vomiting.  Genitourinary: Negative for dysuria and hematuria.  Musculoskeletal: Positive for back pain. Negative for neck pain.  Skin: Negative for rash.  Neurological: Negative  for dizziness, weakness, numbness and headaches.  Psychiatric/Behavioral: Negative for confusion.     Physical Exam Updated Vital Signs BP 127/76 (BP Location: Left Arm)   Pulse 60   Temp 98.1 F (36.7 C) (Oral)   Resp 18   Ht 5\' 8"  (1.727 m)   Wt 97.5 kg (215 lb)   SpO2 100%   BMI 32.69 kg/m   Physical Exam  Constitutional: He appears well-developed and well-nourished.  HENT:  Head: Normocephalic and atraumatic.  Eyes: Conjunctivae and EOM are normal. Right eye exhibits no discharge. Left eye exhibits no discharge. No scleral icterus.  Neck: Full passive range of motion without pain.  Full flexion/extension and lateral movement of neck fully intact. No bony midline tenderness. No deformities or crepitus.   Cardiovascular: Normal rate, regular rhythm and normal pulses.  Pulmonary/Chest: Effort normal.  Abdominal: Normal appearance. There is tenderness. There is no CVA tenderness and no tenderness at McBurney's point. Hernia confirmed negative in the right inguinal area and confirmed negative in the left inguinal area.    Abdomen is soft, nondistended.  Patient has point tenderness at the suprapubic region.  No overlying warmth, erythema.  No McBurney's point tenderness.  No CVA tenderness bilaterally.  Genitourinary: Testes normal and penis normal. Right testis shows no swelling and no tenderness. Left testis shows no swelling and no tenderness.  Genitourinary Comments: The exam was performed with a chaperone present. Normal male genitalia. No evidence of rash, ulcers or lesions.   Musculoskeletal:       Thoracic back: He exhibits no tenderness.       Lumbar back: He exhibits tenderness.       Back:  Tenderness palpation noted to the midline lumbar region.  No deformity or crepitus noted.  No step-offs noted.  No overlying warmth, erythema.  Neurological: He is alert.  Follows commands, Moves all extremities  5/5 strength to BUE and BLE  Sensation intact throughout all major  nerve distributions Normal gait  Skin: Skin is warm and dry.  Psychiatric: He has a normal mood and affect. His speech is normal and behavior is normal.  Nursing note and vitals reviewed.    ED Treatments / Results  Labs (all labs ordered are listed, but only abnormal results are displayed) Labs Reviewed  URINALYSIS, ROUTINE W REFLEX MICROSCOPIC - Abnormal; Notable for the following components:      Result Value   Specific Gravity, Urine <1.005 (*)    Leukocytes, UA TRACE (*)    All other components within normal limits  URINALYSIS, MICROSCOPIC (REFLEX) - Abnormal; Notable for the following components:   Bacteria, UA FEW (*)    All other components within normal limits    EKG None  Radiology Dg Lumbar Spine Complete  Result Date: 10/05/2017 CLINICAL DATA:  Lumbago EXAM: LUMBAR SPINE - COMPLETE 4+ VIEW COMPARISON:  None. FINDINGS: Frontal, lateral, spot lumbosacral lateral, and bilateral oblique views were obtained. There are 5 non-rib-bearing lumbar  type vertebral bodies. There is no fracture or spondylolisthesis. The disc spaces appear normal. There is no appreciable facet arthropathy. IMPRESSION: No fracture or spondylolisthesis.  No appreciable arthropathy. Electronically Signed   By: Bretta Bang III M.D.   On: 10/05/2017 19:17    Procedures Procedures (including critical care time)  Medications Ordered in ED Medications - No data to display   Initial Impression / Assessment and Plan / ED Course  I have reviewed the triage vital signs and the nursing notes.  Pertinent labs & imaging results that were available during my care of the patient were reviewed by me and considered in my medical decision making (see chart for details).     47 year old male who presents for evaluation of back pain that is been ongoing 3 weeks after mowing the lawn.  Reports that several days later coming in having pain in the suprapubic region of the abdomen.  Went to urgent care and had  a normal urinalysis.  Prompted to go to the emergency department for further evaluation.  No fevers, history of saddle anesthesia, bowel or urine incontinence. Patient is afebrile, non-toxic appearing, sitting comfortably on examination table. Vital signs reviewed and stable. No neuro deficits noted on exam.  On exam, patient is tenderness palpation noted to the lower lumbar region.  He also has point tenderness noted to suprapubic region.  No McBurney's point tenderness, flank tenderness, CVA tenderness.  No evidence of hernia.  GU exam is normal.  Consider musculoskeletal pain.  History/physical exam is not concerning for aortic dissection, spinal abscess, cauda equina.  Also consider kidney stone, though low suspicion given history/physical exam.  Plan to check x-ray and urinalysis.  X-ray reviewed.  No acute bony abnormality.  UA shows trace leukocytes but otherwise no acute abnormalities. No hgb.  I discussed with patient.  He has not been having any discomfort with urination or penile discharge.  Discussed with ED attending.  Given symptoms, will plan to evaluate prostate via rectal exam.  I discussed plan with patient.  Patient reports that at the urgent care, they did a rectal exam and stated that it was normal.  Patient does not wish to have this repeated at this time.  I discussed with patient risk first benefits of obtaining a rectal exam for evaluation of prostate.  Patient expresses full understanding does not wish to proceed with rectal exam at this time.  Vital signs are stable.  Patient is ablating in the department without any difficulty.  We will plan to treat as musculoskeletal pain.  Patient with no known drug allergies.  Plan to send home with Robaxin for symptomatic relief.  Patient instructed to follow-up with his primary care doctor in the next 24-48 hours for further evaluation. Patient had ample opportunity for questions and discussion. All patient's questions were answered with full  understanding. Strict return precautions discussed. Patient expresses understanding and agreement to plan.   Final Clinical Impressions(s) / ED Diagnoses   Final diagnoses:  Acute bilateral low back pain without sciatica    ED Discharge Orders        Ordered    methocarbamol (ROBAXIN) 500 MG tablet  2 times daily     10/05/17 2039       Rosana Hoes 10/06/17 1306    Tilden Fossa, MD 10/06/17 1452

## 2017-10-05 NOTE — ED Notes (Signed)
Pt given d/c instructions as per chart. Rx x 1 with precautions. Verbalizes understanding. No questions. 

## 2017-10-05 NOTE — ED Notes (Addendum)
Pt states he began having low back pain radiating around to his lower abd after push mowing a yard. Denies urinary s/s. No discharge. Went to "another clinic" and they "did a urine test" and it came back negative per pt. Ambulatory to ED without difficulty. Took Tylenol with some relief. Pain is worse with bending and sitting.

## 2017-10-05 NOTE — Discharge Instructions (Signed)
As we discussed, is very important for you to follow-up with your primary care doctor in the next 24-40 hours for further evaluation.  You can take Tylenol or Ibuprofen as directed for pain. You can alternate Tylenol and Ibuprofen every 4 hours. If you take Tylenol at 1pm, then you can take Ibuprofen at 5pm. Then you can take Tylenol again at 9pm.   Take Robaxin as prescribed. This medication will make you drowsy so do not drive or drink alcohol when taking it.  Apply heat to the affected area.   Return to the emergency department for any worsening pain, numbness/weakness of your arms or legs, difficulty walking, urinary or bowel incontinence, numbness in her groin, pain with urination, blood in your urine, fever, nausea/vomiting or any other worsening or concerning symptoms.

## 2017-10-05 NOTE — ED Triage Notes (Signed)
PT reports back pain x 3 weeks. States that he has seen his PCP-had a urinalysis that was normal. Denies known injury.

## 2017-10-08 ENCOUNTER — Ambulatory Visit
Admission: RE | Admit: 2017-10-08 | Discharge: 2017-10-08 | Disposition: A | Discharge status: Home / Self Care | Payer: 59 | Source: Ambulatory Visit | Attending: Family Medicine | Admitting: Family Medicine

## 2017-10-08 DIAGNOSIS — K824 Cholesterolosis of gallbladder: Secondary | ICD-10-CM

## 2017-10-08 DIAGNOSIS — K8020 Calculus of gallbladder without cholecystitis without obstruction: Secondary | ICD-10-CM | POA: Diagnosis not present

## 2017-10-13 DIAGNOSIS — R300 Dysuria: Secondary | ICD-10-CM | POA: Diagnosis not present

## 2017-10-13 DIAGNOSIS — S39012D Strain of muscle, fascia and tendon of lower back, subsequent encounter: Secondary | ICD-10-CM | POA: Diagnosis not present

## 2017-10-22 ENCOUNTER — Ambulatory Visit: Payer: 59 | Attending: Family Medicine | Admitting: Physical Therapy

## 2017-10-22 ENCOUNTER — Encounter: Payer: Self-pay | Admitting: Physical Therapy

## 2017-10-22 ENCOUNTER — Other Ambulatory Visit: Payer: Self-pay

## 2017-10-22 DIAGNOSIS — M545 Low back pain, unspecified: Secondary | ICD-10-CM

## 2017-10-22 DIAGNOSIS — M62830 Muscle spasm of back: Secondary | ICD-10-CM | POA: Diagnosis not present

## 2017-10-22 DIAGNOSIS — M6281 Muscle weakness (generalized): Secondary | ICD-10-CM

## 2017-10-22 NOTE — Therapy (Signed)
[facility] [address], [address] [place], [place], [address] Phone: [phone]   Fax:  [phone]  Physical Therapy Evaluation  Patient Details  Name: Lance Warner MRN: [id number] Date of Birth: Mar 27, 1971 Referring Provider: Ileana Ladd, MD   Encounter Date: 10/22/2017  PT End of Session - 10/22/17 1700    Visit Number  1    Number of Visits  9 typical per FOTO    Date for PT Re-Evaluation  12/03/17    PT Start Time  1600    PT Stop Time  1652    PT Time Calculation (min)  52 min    Activity Tolerance  Patient tolerated treatment well;Patient limited by pain    Behavior During Therapy  [facility] for tasks assessed/performed       Past Medical History:  Diagnosis Date  . Arthritis    right knee.  Marland Kitchen GERD (gastroesophageal reflux disease)   . Hyperlipidemia   . Polyp of gallbladder   . Vitamin D deficiency     History reviewed. No pertinent surgical history.  There were no vitals filed for this visit.   Subjective Assessment - 10/22/17 1610    Subjective  Pt states he was pushing the lawn mower and had a lot of pain after waking up the next day.  This occured about one month ago.      Limitations  Sitting;Lifting;House hold activities    How long can you sit comfortably?  1-2 hours    Patient Stated Goals  get rid of pain    Currently in Pain?  Yes    Pain Score  3  was 8/10    Pain Location  Back    Pain Orientation  Lower;Right    Pain Descriptors / Indicators  Sharp;Tightness    Pain Type  Acute pain    Pain Radiating Towards  was switching side to side    Pain Onset  More than a month ago    Pain Frequency  Intermittent    Aggravating Factors   sleeping or sitting with more weight on one side; bending forward for 5 minutes (changing child's clothes)    Pain Relieving Factors  standing and walking; currently taking anitbiotics for bladder issue and that has helped the pain, rotation stretches andpopping  back    Effect of Pain on Daily Activities  able to do everything but pain with leaning forward or driving    Multiple Pain Sites  No         [facility] PT Assessment - 10/22/17 0001      Assessment   Medical Diagnosis  S39.012A (ICD-10-CM) - Strain of muscle, fascia and tendon of lower back, initial encounter    Referring Provider  Ileana Ladd, MD    Onset Date/Surgical Date  -- one month ago    Prior Therapy  no      Precautions   Precautions  None      Restrictions   Weight Bearing Restrictions  No      Balance Screen   Has the patient fallen in the past 6 months  No      Home Environment   Living Environment  Private residence    Living Arrangements  Children;Spouse/significant other      Prior Function   Level of Independence  Independent    Vocation  Part time employment singer      Cognition   Overall Cognitive Status  Within Functional Limits for tasks  assessed      Observation/Other Assessments   Focus on Therapeutic Outcomes (FOTO)   31% limited      Posture/Postural Control   Posture/Postural Control  Postural limitations    Postural Limitations  Rounded Shoulders;Increased lumbar lordosis;Anterior pelvic tilt      ROM / Strength   AROM / PROM / Strength  AROM;Strength      AROM   Overall AROM Comments  lumbar flexion 50% limited with increased pain      Strength   Strength Assessment Site  Hip    Right/Left Hip  Right;Left    Right Hip Flexion  5/5    Right Hip Extension  4/5    Right Hip External Rotation   5/5    Right Hip Internal Rotation  5/5    Right Hip ABduction  5/5    Right Hip ADduction  4-/5    Left Hip Flexion  5/5    Left Hip Extension  4/5    Left Hip External Rotation  5/5    Left Hip Internal Rotation  5/5    Left Hip ABduction  5/5    Left Hip ADduction  4-/5      Flexibility   Soft Tissue Assessment /Muscle Length  yes    Hamstrings  50% limited and painful    Quadratus Lumborum  50% limited      Palpation   Palpation  comment  tender and tight bilateral lumbar paraapinals, QL      Special Tests    Special Tests  Lumbar    Lumbar Tests  Straight Leg Raise      Straight Leg Raise   Findings  Positive    Comment  right and left increased pain; no radiating pain      Ambulation/Gait   Gait Pattern  Decreased stride length                Objective measurements completed on examination: See above findings.              PT Education - 10/22/17 1701    Education provided  Yes    Education Details  dry needling info; access codeAccess Code: [email or web address]    Person(s) Educated  Patient    Methods  Explanation;Demonstration;Handout    Comprehension  Verbalized understanding;Returned demonstration       PT Short Term Goals - 10/22/17 1717      PT SHORT TERM GOAL #1   Title  ind with initial HEP    Time  3    Period  Weeks    Status  New    Target Date  11/12/17      PT SHORT TERM GOAL #2   Title  pt reports 20% less pain    Time  3    Period  Weeks    Status  New    Target Date  11/12/17        PT Long Term Goals - 10/22/17 1718      PT LONG TERM GOAL #1   Title  ind with advanced HEP    Time  6    Period  Weeks    Status  New    Target Date  12/03/17      PT LONG TERM GOAL #2   Title  Pt will be able to bend forward with good posture for the amount of time needed to change his child's clothes in order to manage pain    Time  6    Period  Weeks    Status  New    Target Date  12/03/17      PT LONG TERM GOAL #3   Title  FOTO < or = to 19%    Time  6    Period  Weeks    Status  New    Target Date  12/03/17      PT LONG TERM GOAL #4   Title  Pt will demonstrate increased LE strength of 5/5 hip adduction for improved stability during functional activities    Time  6    Period  Weeks    Status  New    Target Date  12/03/17      PT LONG TERM GOAL #5   Title  pt will report 80% reduction of pain due to improved core strength and posture    Time  6    Period   Weeks    Status  New    Target Date  12/03/17             Plan - 10/22/17 1711    Clinical Impression Statement  Pt presents to clnic with acute onset of low back pain brought on by using the lawn mower.  Pt states he has been feeling better but still having pain when bending forward.  Pt demonstrates anterior pelvic tilt and abnormalities of posture as remarked above.  He has some LE and core weakness.  Pt has limited hamstring flexibility and limited lumbar flexion ROM.  Pt feels better with lumbar extension.  Pt has muscle spasms of lumbar paraspinals and QL. Patient will benefit from skilled PTto address impairments and return to functional acitvities without being limited by pain.    History and Personal Factors relevant to plan of care:  english second language and takes longer to explain things, patient is motivated    Clinical Presentation  Stable    Clinical Presentation due to:  pt is stable    Clinical Decision Making  Low    Rehab Potential  Excellent    PT Frequency  2x / week    PT Duration  6 weeks    PT Treatment/Interventions  ADLs/Self Care Home Management;Biofeedback;Cryotherapy;Electrical Stimulation;Moist Heat;Gait training;Stair training;Functional mobility training;Therapeutic activities;Therapeutic exercise;Balance training;Neuromuscular re-education;Patient/family education;Manual techniques;Passive range of motion;Dry needling;Taping    PT Next Visit Plan  dry needling and manual to lumbar paraapinals, QL, hamstring stretches, prone press up, review posture    Recommended Other Services  Access Code: [email or web address]    Consulted and Agree with Plan of Care  Patient       Patient will benefit from skilled therapeutic intervention in order to improve the following deficits and impairments:  Pain, Postural dysfunction, Decreased strength, Decreased range of motion, Increased muscle spasms  Visit Diagnosis: Acute bilateral low back pain without sciatica  Muscle  weakness (generalized)  Muscle spasm of back     Problem List Patient Active Problem List   Diagnosis Date Noted  . Thoracic radiculopathy 08/12/2013  . Elevated serum creatinine 07/15/2013  . Physical exam, annual 07/05/2013  . Screening for prostate cancer 07/05/2013  . Screening examination for infectious disease 07/05/2013  . HLD (hyperlipidemia) 07/05/2013  . Unspecified vitamin D deficiency 07/05/2013  . Arthritis   . GERD (gastroesophageal reflux disease) 05/07/2011    Vincente Poli, PT 10/22/2017, 5:28 PM  [facility] [address] [place], [place], [address] Phone: [phone]   Fax:  [phone]  Name: Lance Warner MRN: [id number] Date of Birth: 29-Aug-1970

## 2017-10-22 NOTE — Patient Instructions (Signed)
Trigger Point Dry Needling  . What is Trigger Point Dry Needling (DN)? o DN is a physical therapy technique used to treat muscle pain and dysfunction. Specifically, DN helps deactivate muscle trigger points (muscle knots).  o A thin filiform needle is used to penetrate the skin and stimulate the underlying trigger point. The goal is for a local twitch response (LTR) to occur and for the trigger point to relax. No medication of any kind is injected during the procedure.   . What Does Trigger Point Dry Needling Feel Like?  o The procedure feels different for each individual patient. Some patients report that they do not actually feel the needle enter the skin and overall the process is not painful. Very mild bleeding may occur. However, many patients feel a deep cramping in the muscle in which the needle was inserted. This is the local twitch response.   Marland Kitchen How Will I feel after the treatment? o Soreness is normal, and the onset of soreness may not occur for a few hours. Typically this soreness does not last longer than two days.  o Bruising is uncommon, however; ice can be used to decrease any possible bruising.  o In rare cases feeling tired or nauseous after the treatment is normal. In addition, your symptoms may get worse before they get better, this period will typically not last longer than 24 hours.   . What Can I do After My Treatment? o Increase your hydration by drinking more water for the next 24 hours. o You may place ice or heat on the areas treated that have become sore, however, do not use heat on inflamed or bruised areas. Heat often brings more relief post needling. o You can continue your regular activities, but vigorous activity is not recommended initially after the treatment for 24 hours. o DN is best combined with other physical therapy such as strengthening, stretching, and other therapies.    [facility] [address] [place], [place]  [address] Phone # [phone] Fax [phone]   Access Code: [email or web address]  URL: [email or web address]  Date: 10/22/2017  Prepared by: Dorie Rank   Exercises  Seated Transversus Abdominis Bracing - 10 reps - 3 sets - 1x daily - 7x weekly  Correct Standing Posture - 10 reps - 3 sets - 1x daily - 7x weekly  Correct Seated Posture - 10 reps - 3 sets - 1x daily - 7x weekly  Patient Education  Lifting Techniques  Office Posture  Sleep Positions  Log Roll  Low Back Pain  Low Back Pain Handout

## 2017-10-27 ENCOUNTER — Encounter: Payer: Self-pay | Admitting: Physical Therapy

## 2017-10-27 ENCOUNTER — Ambulatory Visit: Payer: 59 | Admitting: Physical Therapy

## 2017-10-27 DIAGNOSIS — M545 Low back pain, unspecified: Secondary | ICD-10-CM

## 2017-10-27 DIAGNOSIS — S39012D Strain of muscle, fascia and tendon of lower back, subsequent encounter: Secondary | ICD-10-CM | POA: Diagnosis not present

## 2017-10-27 DIAGNOSIS — M62830 Muscle spasm of back: Secondary | ICD-10-CM

## 2017-10-27 DIAGNOSIS — R300 Dysuria: Secondary | ICD-10-CM | POA: Diagnosis not present

## 2017-10-27 DIAGNOSIS — M6281 Muscle weakness (generalized): Secondary | ICD-10-CM

## 2017-10-27 NOTE — Therapy (Signed)
[facility] [address] [place], [place], [address] Phone: [phone]   Fax:  [phone]  Physical Therapy Treatment  Patient Details  Name: Lance Warner MRN: [id number] Date of Birth: 06/02/1971 Referring Provider: Ileana Ladd, MD   Encounter Date: 10/27/2017  PT End of Session - 10/27/17 0845    Visit Number  2    Number of Visits  9    Date for PT Re-Evaluation  12/03/17    PT Start Time  0845    PT Stop Time  0945    PT Time Calculation (min)  60 min    Activity Tolerance  Patient tolerated treatment well;Patient limited by pain    Behavior During Therapy  [facility] for tasks assessed/performed       Past Medical History:  Diagnosis Date  . Arthritis    right knee.  Marland Kitchen GERD (gastroesophageal reflux disease)   . Hyperlipidemia   . Polyp of gallbladder   . Vitamin D deficiency     History reviewed. No pertinent surgical history.  There were no vitals filed for this visit.  Subjective Assessment - 10/27/17 0857    Subjective  My back is hurting my this AM.    Limitations  Sitting;Lifting;House hold activities    How long can you sit comfortably?  1-2 hours    Currently in Pain?  Yes    Pain Score  4     Pain Location  Back    Pain Orientation  Right;Lower    Pain Descriptors / Indicators  Tender;Sore    Aggravating Factors   Been fairly constant last few days    Pain Relieving Factors  Moving around some.     Multiple Pain Sites  No                       [facility] Adult PT Treatment/Exercise - 10/27/17 0001      Self-Care   Self-Care  Other Self-Care Comments    Other Self-Care Comments   Sleeping with pillow between knees. Pt will try.      Lumbar Exercises: Stretches   Active Hamstring Stretch  -- Attempted in supine but too painful, stopped    Single Knee to Chest Stretch  -- RT 3x 3 breaths      Lumbar Exercises: Aerobic   Nustep  L1 x 10 min PTA present for status update       Mudlogger Action  IFC    Electrical Stimulation Parameters  80-150 HZ    Electrical Stimulation Goals  Pain      Manual Therapy   Soft tissue mobilization  In Lt sidelying, RT lower lumbar. Most tender centrally at spine.              PT Education - 10/27/17 0902    Education provided  Yes    Education Details  Standing RT angle stretch for QL, supine single knee to chest stretch    Person(s) Educated  Patient    Methods  Explanation;Demonstration;Tactile cues;Verbal cues    Comprehension  Returned demonstration;Verbalized understanding       PT Short Term Goals - 10/22/17 1717      PT SHORT TERM GOAL #1   Title  ind with initial HEP    Time  3    Period  Weeks    Status  New  Target Date  11/12/17      PT SHORT TERM GOAL #2   Title  pt reports 20% less pain    Time  3    Period  Weeks    Status  New    Target Date  11/12/17        PT Long Term Goals - 10/22/17 1718      PT LONG TERM GOAL #1   Title  ind with advanced HEP    Time  6    Period  Weeks    Status  New    Target Date  12/03/17      PT LONG TERM GOAL #2   Title  Pt will be able to bend forward with good posture for the amount of time needed to change his child's clothes in order to manage pain    Time  6    Period  Weeks    Status  New    Target Date  12/03/17      PT LONG TERM GOAL #3   Title  FOTO < or = to 19%    Time  6    Period  Weeks    Status  New    Target Date  12/03/17      PT LONG TERM GOAL #4   Title  Pt will demonstrate increased LE strength of 5/5 hip adduction for improved stability during functional activities    Time  6    Period  Weeks    Status  New    Target Date  12/03/17      PT LONG TERM GOAL #5   Title  pt will report 80% reduction of pain due to improved core strength and posture    Time  6    Period  Weeks    Status  New    Target Date  12/03/17             Plan - 10/27/17 0935    Clinical Impression Statement  Pt presented with complaints of RT sided low back pain that thorughout the session became more specific to central back pain. Pt performed stretches in standing and in supine for his back/QL and these were then added to his HEP. Pt could not tolerate supine hamstring stretch as he said it made his back hurt too much.  With manual work RT QL felt thicker than on on LT but was not tender. Pt was pretty tender just Rt of spine. Session ended with Ellectric stimulation to his central low back. He reported at end of session his back felt "much better."     Rehab Potential  Excellent    PT Frequency  2x / week    PT Duration  6 weeks    PT Treatment/Interventions  ADLs/Self Care Home Management;Biofeedback;Cryotherapy;Electrical Stimulation;Moist Heat;Gait training;Stair training;Functional mobility training;Therapeutic activities;Therapeutic exercise;Balance training;Neuromuscular re-education;Patient/family education;Manual techniques;Passive range of motion;Dry needling;Taping    PT Next Visit Plan  dry needling and manual to lumbar paraapinals, QL, hamstring stretches, prone press ups. E-stim at end if pt felt benefit.     Consulted and Agree with Plan of Care  Patient       Patient will benefit from skilled therapeutic intervention in order to improve the following deficits and impairments:  Pain, Postural dysfunction, Decreased strength, Decreased range of motion, Increased muscle spasms  Visit Diagnosis: Acute bilateral low back pain without sciatica  Muscle weakness (generalized)  Muscle spasm of back     Problem List  Patient Active Problem List   Diagnosis Date Noted  . Thoracic radiculopathy 08/12/2013  . Elevated serum creatinine 07/15/2013  . Physical exam, annual 07/05/2013  . Screening for prostate cancer 07/05/2013  . Screening examination for infectious disease 07/05/2013  . HLD (hyperlipidemia) 07/05/2013   . Unspecified vitamin D deficiency 07/05/2013  . Arthritis   . GERD (gastroesophageal reflux disease) 05/07/2011    Willem Klingensmith, PTA 10/27/2017, 11:06 AM  [facility] [address] [place], [place], [address] Phone: [phone]   Fax:  [phone]  Name: Lance Warner MRN: [id number] Date of Birth: 11-24-1970

## 2017-10-27 NOTE — Patient Instructions (Signed)
Supine Knee to Chest    Lie on back. Gently pull right knee toward chest. Hold 20___ seconds.  Repeat _3__ times per session. Do _3__ sessions per day. Make sure you bend the LT knee. Use a pillow!!   #2 Throughout the day stand by stable surface. Put your hands on top and walk your feet backwards. Head between your arms., Sit backwards feeling a stretch along your back. Hold and breathe! Relax.Marland Kitchenabout 10-20 sec 1-3x do 3x day     Copyright  VHI. All rights reserved.

## 2017-10-30 ENCOUNTER — Ambulatory Visit: Payer: 59 | Admitting: Physical Therapy

## 2017-10-30 DIAGNOSIS — M6281 Muscle weakness (generalized): Secondary | ICD-10-CM

## 2017-10-30 DIAGNOSIS — M545 Low back pain, unspecified: Secondary | ICD-10-CM

## 2017-10-30 DIAGNOSIS — M62830 Muscle spasm of back: Secondary | ICD-10-CM

## 2017-10-30 NOTE — Therapy (Addendum)
[facility] [address] [place], [place], [address] Phone: [phone]   Fax:  [phone]  Physical Therapy Treatment/Discharge Summary   Patient Details  Name: Lance Warner MRN: [id number] Date of Birth: [birth year] Referring Provider: Vernie Shanks, MD   Encounter Date: 10/30/2017  PT End of Session - 10/30/17 0834    Visit Number  3    Number of Visits  9    Date for PT Re-Evaluation  12/03/17    PT Start Time  0752    PT Stop Time  0840    PT Time Calculation (min)  48 min    Activity Tolerance  Patient tolerated treatment well       Past Medical History:  Diagnosis Date  . Arthritis    right knee.  Marland Kitchen GERD (gastroesophageal reflux disease)   . Hyperlipidemia   . Polyp of gallbladder   . Vitamin D deficiency     No past surgical history on file.  There were no vitals filed for this visit.  Subjective Assessment - 10/30/17 0753    Subjective  Less back pain center and right.   (Pended)     Currently in Pain?  Yes  (Pended)     Pain Score  1   (Pended)     Pain Location  Back  (Pended)     Pain Orientation  Right  (Pended)     Pain Type  Acute pain  (Pended)     Pain Relieving Factors  sidelying  (Pended)                        [facility] Adult PT Treatment/Exercise - 10/30/17 0001      Lumbar Exercises: Standing   Other Standing Lumbar Exercises  standing extensions 10x over elevated table    Other Standing Lumbar Exercises  wall right side glides 10x      Lumbar Exercises: Prone   Other Prone Lumbar Exercises  press ups with and without roadkill position 10x      Electrical Stimulation   Electrical Stimulation Location  Central lumbar and right lumbar    Electrical Stimulation Action  IFC    Electrical Stimulation Parameters  5 ma 15 min left sidelying     Electrical Stimulation Goals  Pain      Manual Therapy   Joint Mobilization  neutral gapping grade 4; pelvic  distraction right grade 4 5x each    Soft tissue mobilization  bil paraspinals, right quadratus lumborum        Trigger Point Dry Needling - 10/30/17 0843    Consent Given?  Yes    Education Handout Provided  Yes    Muscles Treated Upper Body  Quadratus Lumborum right    Muscles Treated Lower Body  -- bil lumbar multifidi           PT Education - 10/30/17 0833    Education provided  Yes    Education Details  prone press ups, standing extensions, wall side glide; dry needling after care    Person(s) Educated  Patient    Methods  Demonstration;Explanation;Handout    Comprehension  Returned demonstration;Verbalized understanding       PT Short Term Goals - 10/22/17 1717      PT SHORT TERM GOAL #1   Title  ind with initial HEP    Time  3    Period  Weeks    Status  New  Target Date  11/12/17      PT SHORT TERM GOAL #2   Title  pt reports 20% less pain    Time  3    Period  Weeks    Status  New    Target Date  11/12/17        PT Long Term Goals - 10/22/17 1718      PT LONG TERM GOAL #1   Title  ind with advanced HEP    Time  6    Period  Weeks    Status  New    Target Date  12/03/17      PT LONG TERM GOAL #2   Title  Pt will be able to bend forward with good posture for the amount of time needed to change his child's clothes in order to manage pain    Time  6    Period  Weeks    Status  New    Target Date  12/03/17      PT LONG TERM GOAL #3   Title  FOTO < or = to 19%    Time  6    Period  Weeks    Status  New    Target Date  12/03/17      PT LONG TERM GOAL #4   Title  Pt will demonstrate increased LE strength of 5/5 hip adduction for improved stability during functional activities    Time  6    Period  Weeks    Status  New    Target Date  12/03/17      PT LONG TERM GOAL #5   Title  pt will report 80% reduction of pain due to improved core strength and posture    Time  6    Period  Weeks    Status  New    Target Date  12/03/17             Plan - 10/30/17 1044    Clinical Impression Statement  The patient reports decreasing back pain intensity but still present with lying on his back.   He is able to perform extension biased exercise with and without a lateral component with good mobility but unable to fully centralize.  He is somewhat sensitive to dry needling but improved soft tissue mobility following DN and manual therapy.  Therapist closely monitoring response with all interventions.      Rehab Potential  Excellent    PT Frequency  2x / week    PT Duration  6 weeks    PT Treatment/Interventions  ADLs/Self Care Home Management;Biofeedback;Cryotherapy;Electrical Stimulation;Moist Heat;Gait training;Stair training;Functional mobility training;Therapeutic activities;Therapeutic exercise;Balance training;Neuromuscular re-education;Patient/family education;Manual techniques;Passive range of motion;Dry needling;Taping    PT Next Visit Plan  assess response to dry needling, prone press up with increased frequency of performance to every 2 hours.  E-stim/heat as needed       Patient will benefit from skilled therapeutic intervention in order to improve the following deficits and impairments:  Pain, Postural dysfunction, Decreased strength, Decreased range of motion, Increased muscle spasms  Visit Diagnosis: Acute bilateral low back pain without sciatica  Muscle weakness (generalized)  Muscle spasm of back    PHYSICAL THERAPY DISCHARGE SUMMARY  Visits from Start of Care: 3  Current functional level related to goals / functional outcomes: The patient did not return following his last scheduled appointment on 10/30/17.  Will discharge from PT at this time.     Remaining deficits: As above  Education / Equipment: Basic HEP Plan:                                                    Patient goals were not met. Patient is being discharged due to not returning since the last visit.  ?????          Problem  List Patient Active Problem List   Diagnosis Date Noted  . Thoracic radiculopathy 08/12/2013  . Elevated serum creatinine 07/15/2013  . Physical exam, annual 07/05/2013  . Screening for prostate cancer 07/05/2013  . Screening examination for infectious disease 07/05/2013  . HLD (hyperlipidemia) 07/05/2013  . Unspecified vitamin D deficiency 07/05/2013  . Arthritis   . GERD (gastroesophageal reflux disease) 05/07/2011   Ruben Im, PT 10/30/17 11:19 AM Phone: [phone] Fax: [phone]  Alvera Singh 10/30/2017, 11:19 AM  [facility] [address], [place], [place], [address] Phone: [phone]   Fax:  [phone]  Name: Lance Warner MRN: [id number] Date of Birth: 10-05-1970

## 2017-10-30 NOTE — Patient Instructions (Addendum)
     Trigger Point Dry Needling  . What is Trigger Point Dry Needling (DN)? o DN is a physical therapy technique used to treat muscle pain and dysfunction. Specifically, DN helps deactivate muscle trigger points (muscle knots).  o A thin filiform needle is used to penetrate the skin and stimulate the underlying trigger point. The goal is for a local twitch response (LTR) to occur and for the trigger point to relax. No medication of any kind is injected during the procedure.   . What Does Trigger Point Dry Needling Feel Like?  o The procedure feels different for each individual patient. Some patients report that they do not actually feel the needle enter the skin and overall the process is not painful. Very mild bleeding may occur. However, many patients feel a deep cramping in the muscle in which the needle was inserted. This is the local twitch response.   . How Will I feel after the treatment? o Soreness is normal, and the onset of soreness may not occur for a few hours. Typically this soreness does not last longer than two days.  o Bruising is uncommon, however; ice can be used to decrease any possible bruising.  o In rare cases feeling tired or nauseous after the treatment is normal. In addition, your symptoms may get worse before they get better, this period will typically not last longer than 24 hours.   . What Can I do After My Treatment? o Increase your hydration by drinking more water for the next 24 hours. o You may place ice or heat on the areas treated that have become sore, however, do not use heat on inflamed or bruised areas. Heat often brings more relief post needling. o You can continue your regular activities, but vigorous activity is not recommended initially after the treatment for 24 hours. o DN is best combined with other physical therapy such as strengthening, stretching, and other therapies.    Verlene Glantz PT [facility] [address], [address] [place], [place] [address] Phone # [phone] Fax [phone] 

## 2017-12-09 DIAGNOSIS — K219 Gastro-esophageal reflux disease without esophagitis: Secondary | ICD-10-CM | POA: Diagnosis not present

## 2017-12-09 DIAGNOSIS — S39012D Strain of muscle, fascia and tendon of lower back, subsequent encounter: Secondary | ICD-10-CM | POA: Diagnosis not present

## 2017-12-09 DIAGNOSIS — R300 Dysuria: Secondary | ICD-10-CM | POA: Diagnosis not present

## 2018-03-13 DIAGNOSIS — E559 Vitamin D deficiency, unspecified: Secondary | ICD-10-CM | POA: Diagnosis not present

## 2018-03-13 DIAGNOSIS — M545 Low back pain: Secondary | ICD-10-CM | POA: Diagnosis not present

## 2018-03-13 DIAGNOSIS — K219 Gastro-esophageal reflux disease without esophagitis: Secondary | ICD-10-CM | POA: Diagnosis not present

## 2018-05-10 DIAGNOSIS — J209 Acute bronchitis, unspecified: Secondary | ICD-10-CM | POA: Diagnosis not present

## 2018-05-28 IMAGING — CR DG LUMBAR SPINE COMPLETE 4+V
5 series · 5 of 5 positions shown · non-contrast
Comparison: None.

CLINICAL DATA: Lumbago

EXAM:
LUMBAR SPINE - COMPLETE 4+ VIEW

[t l-spine a.p.]
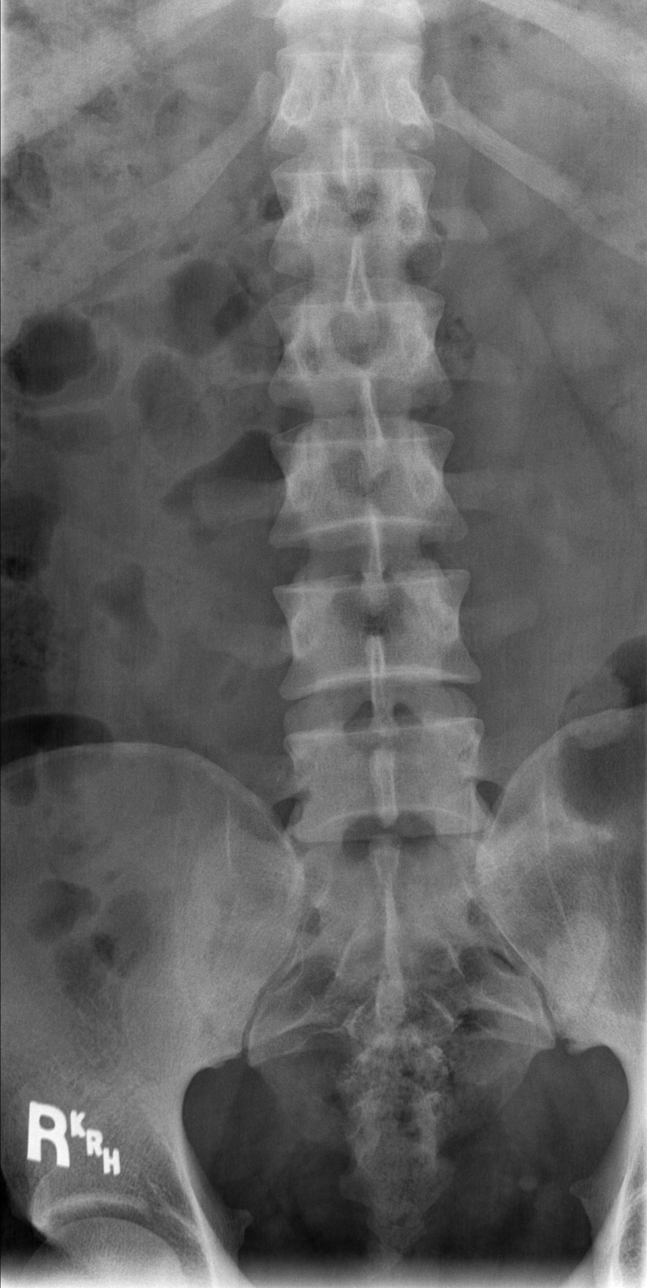

[t l-spine oblique exposure (1 of 2)]
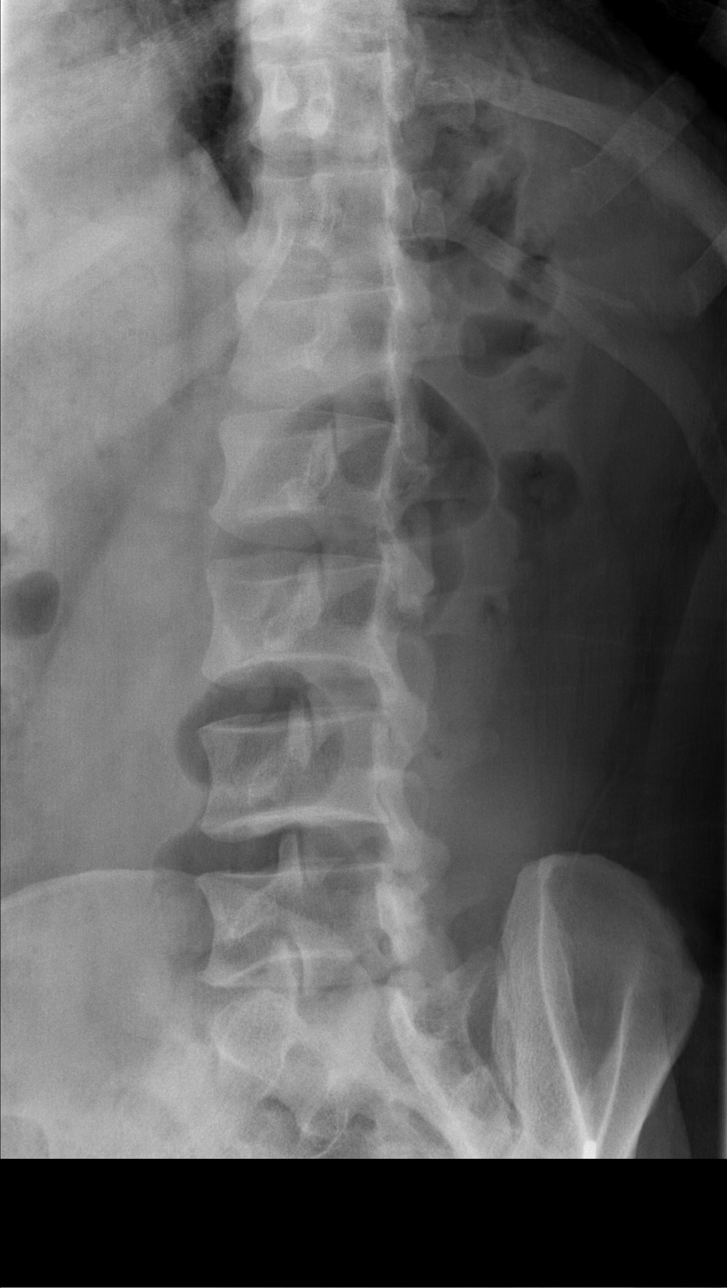

[t l-spine oblique exposure (2 of 2)]
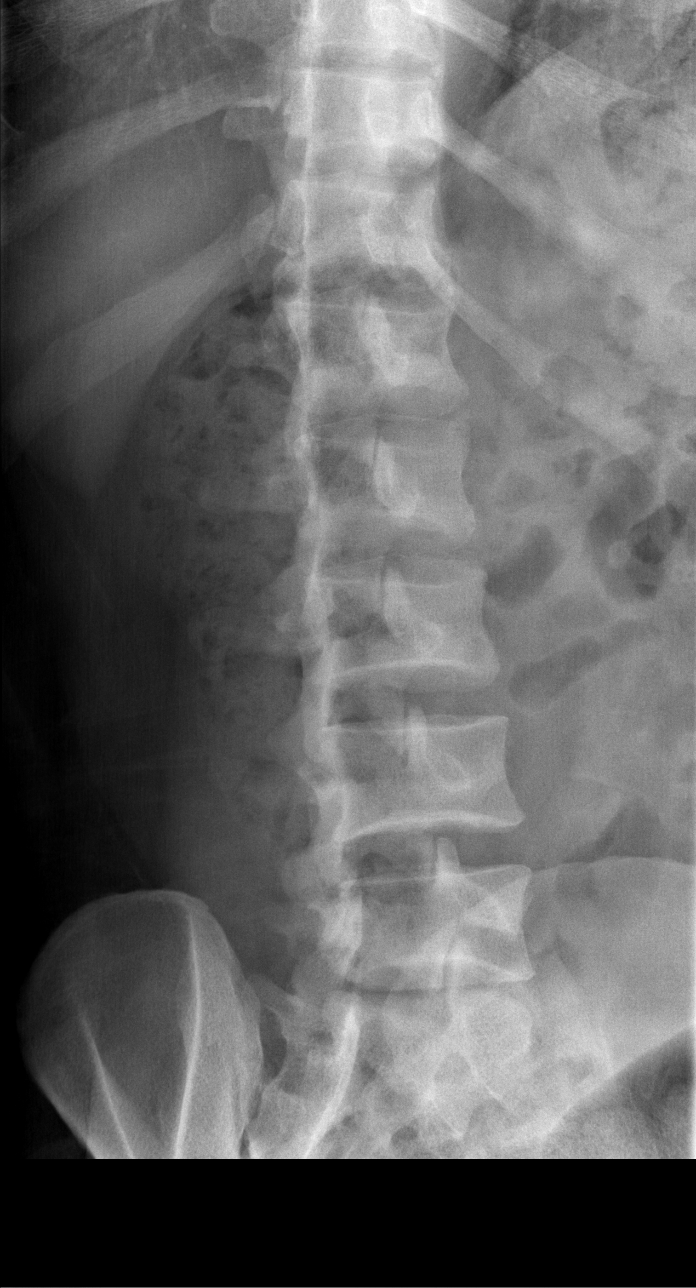

[t l-spine lat]
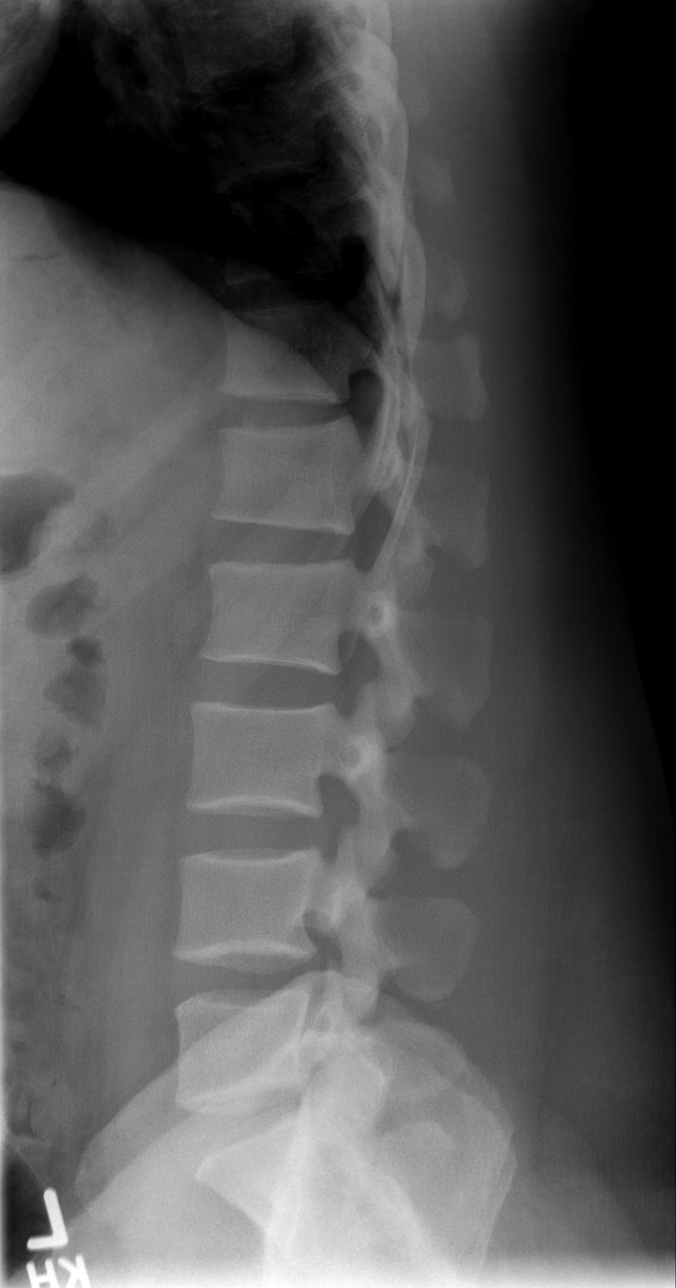

[t l-spine l5-s1 spot]
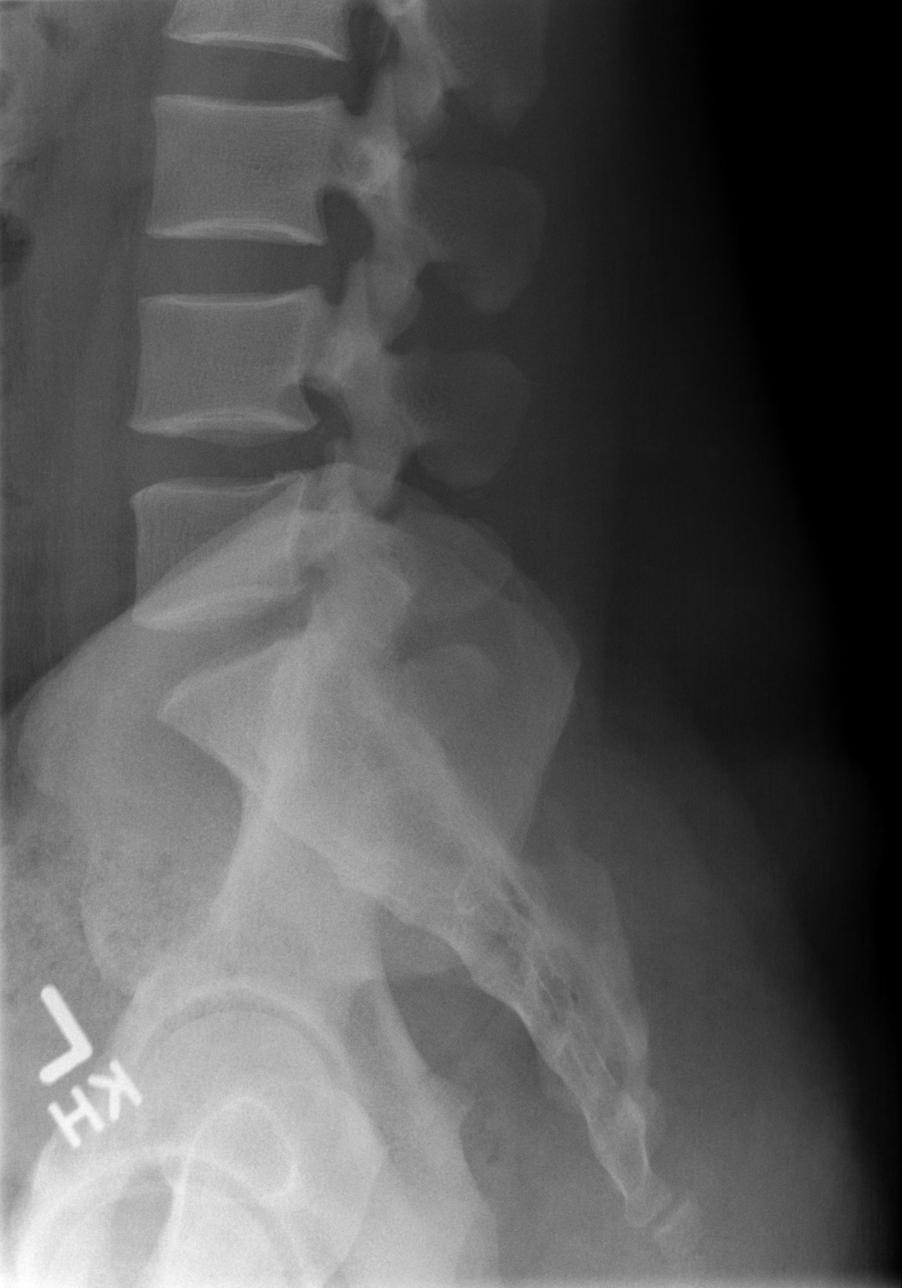

[5 of 5 positions shown; findings below may reference images not displayed]

FINDINGS: Frontal, lateral, spot lumbosacral lateral, and bilateral oblique
views were obtained. There are 5 non-rib-bearing lumbar type
vertebral bodies. There is no fracture or spondylolisthesis. The
disc spaces appear normal. There is no appreciable facet
arthropathy.
IMPRESSION: No fracture or spondylolisthesis.  No appreciable arthropathy.

## 2018-05-29 DIAGNOSIS — Z23 Encounter for immunization: Secondary | ICD-10-CM | POA: Diagnosis not present

## 2018-05-29 DIAGNOSIS — R7989 Other specified abnormal findings of blood chemistry: Secondary | ICD-10-CM | POA: Diagnosis not present

## 2018-05-29 DIAGNOSIS — E559 Vitamin D deficiency, unspecified: Secondary | ICD-10-CM | POA: Diagnosis not present

## 2018-05-29 DIAGNOSIS — Z79899 Other long term (current) drug therapy: Secondary | ICD-10-CM | POA: Diagnosis not present

## 2018-05-29 DIAGNOSIS — E7800 Pure hypercholesterolemia, unspecified: Secondary | ICD-10-CM | POA: Diagnosis not present

## 2018-05-29 DIAGNOSIS — Z1159 Encounter for screening for other viral diseases: Secondary | ICD-10-CM | POA: Diagnosis not present

## 2018-05-29 DIAGNOSIS — Z0000 Encounter for general adult medical examination without abnormal findings: Secondary | ICD-10-CM | POA: Diagnosis not present

## 2019-06-16 ENCOUNTER — Other Ambulatory Visit: Payer: Self-pay

## 2019-06-16 ENCOUNTER — Encounter (HOSPITAL_BASED_OUTPATIENT_CLINIC_OR_DEPARTMENT_OTHER): Payer: Self-pay

## 2019-06-16 ENCOUNTER — Emergency Department (HOSPITAL_BASED_OUTPATIENT_CLINIC_OR_DEPARTMENT_OTHER)
Admission: EM | Admit: 2019-06-16 | Discharge: 2019-06-17 | Disposition: A | Discharge status: Home / Self Care | Payer: 59 | Attending: Emergency Medicine | Admitting: Emergency Medicine

## 2019-06-16 DIAGNOSIS — R350 Frequency of micturition: Secondary | ICD-10-CM | POA: Diagnosis present

## 2019-06-16 DIAGNOSIS — N289 Disorder of kidney and ureter, unspecified: Secondary | ICD-10-CM | POA: Diagnosis not present

## 2019-06-16 DIAGNOSIS — Z79899 Other long term (current) drug therapy: Secondary | ICD-10-CM | POA: Insufficient documentation

## 2019-06-16 DIAGNOSIS — N12 Tubulo-interstitial nephritis, not specified as acute or chronic: Secondary | ICD-10-CM | POA: Diagnosis not present

## 2019-06-16 LAB — CBC WITH DIFFERENTIAL/PLATELET
Abs Immature Granulocytes: 0.05 10*3/uL (ref 0.00–0.07)
Basophils Absolute: 0 10*3/uL (ref 0.0–0.1)
Basophils Relative: 0 %
Eosinophils Absolute: 0 10*3/uL (ref 0.0–0.5)
Eosinophils Relative: 0 %
HCT: 48.5 % (ref 39.0–52.0)
Hemoglobin: 15.5 g/dL (ref 13.0–17.0)
Immature Granulocytes: 0 %
Lymphocytes Relative: 11 %
Lymphs Abs: 1.6 10*3/uL (ref 0.7–4.0)
MCH: 26.4 pg (ref 26.0–34.0)
MCHC: 32 g/dL (ref 30.0–36.0)
MCV: 82.6 fL (ref 80.0–100.0)
Monocytes Absolute: 0.9 10*3/uL (ref 0.1–1.0)
Monocytes Relative: 6 %
Neutro Abs: 11.6 10*3/uL — ABNORMAL HIGH (ref 1.7–7.7)
Neutrophils Relative %: 83 %
Platelets: 301 10*3/uL (ref 150–400)
RBC: 5.87 MIL/uL — ABNORMAL HIGH (ref 4.22–5.81)
RDW: 16.9 % — ABNORMAL HIGH (ref 11.5–15.5)
WBC: 14.2 10*3/uL — ABNORMAL HIGH (ref 4.0–10.5)
nRBC: 0 % (ref 0.0–0.2)

## 2019-06-16 LAB — BASIC METABOLIC PANEL
Anion gap: 12 (ref 5–15)
BUN: 12 mg/dL (ref 6–20)
CO2: 25 mmol/L (ref 22–32)
Calcium: 9.3 mg/dL (ref 8.9–10.3)
Chloride: 101 mmol/L (ref 98–111)
Creatinine, Ser: 1.38 mg/dL — ABNORMAL HIGH (ref 0.61–1.24)
GFR calc Af Amer: >60 mL/min (ref >60)
GFR calc non Af Amer: >60 mL/min (ref >60)
Glucose, Bld: 109 mg/dL — ABNORMAL HIGH (ref 70–99)
Potassium: 3.9 mmol/L (ref 3.5–5.1)
Sodium: 138 mmol/L (ref 135–145)

## 2019-06-16 LAB — URINALYSIS, ROUTINE W REFLEX MICROSCOPIC
Bilirubin Urine: NEGATIVE
Glucose, UA: NEGATIVE mg/dL
Ketones, ur: NEGATIVE mg/dL
Nitrite: NEGATIVE
Protein, ur: NEGATIVE mg/dL
Specific Gravity, Urine: <1.005 — ABNORMAL LOW (ref 1.005–1.030)
pH: 6 (ref 5.0–8.0)

## 2019-06-16 LAB — URINALYSIS, MICROSCOPIC (REFLEX)

## 2019-06-16 MED ORDER — CEPHALEXIN 500 MG PO CAPS: 500.0000 mg | ORAL_CAPSULE | Freq: Four times a day (QID) | ORAL | 0 refills | Status: DC

## 2019-06-16 MED ORDER — SODIUM CHLORIDE 0.9 % IV SOLN
1.0000 g | Freq: Once | INTRAVENOUS | Status: AC
  Administered 2019-06-16: 1 g via INTRAVENOUS
  Filled 2019-06-16: qty 10

## 2019-06-16 MED ORDER — IBUPROFEN 400 MG PO TABS
600.0000 mg | ORAL_TABLET | Freq: Once | ORAL | Status: AC
Start: 2019-06-16 — End: 2019-06-16
  Administered 2019-06-16: 600 mg via ORAL
  Filled 2019-06-16: qty 1

## 2019-06-16 MED ORDER — PHENAZOPYRIDINE HCL 200 MG PO TABS: 200.0000 mg | ORAL_TABLET | Freq: Three times a day (TID) | ORAL | 0 refills | Status: DC

## 2019-06-16 MED ORDER — SODIUM CHLORIDE 0.9 % IV BOLUS
1000.0000 mL | Freq: Once | INTRAVENOUS | Status: AC
  Administered 2019-06-16: 1000 mL via INTRAVENOUS

## 2019-06-16 NOTE — Discharge Instructions (Signed)
You were seen in the ER for burning with urination, fever and abdominal pain.  Work-up today suggestive of urinary tract infection and infection in your kidneys.  You were given antibiotics here and IV fluids for your fever and infection  Take Keflex as prescribed for the infection.  Pyridium is a medicine that can help with bladder pain and burning with urination, this medicine can cause right yellow or orange urine but this will resolve after he stopped taking the medicine.  Stay hydrated.  Symptoms should improve in the next 48 hours of antibiotics.  Return to the ER for worsening symptoms, persistent fever, inability to void

## 2019-06-16 NOTE — ED Triage Notes (Signed)
Pt c/o urinary freq., difficult to urinate-started yesterday-fever today-NAD-steady gait-last dose tylenol ~6pm

## 2019-06-16 NOTE — ED Provider Notes (Addendum)
[facility] EMERGENCY DEPARTMENT Provider Note   CSN: [id number] Arrival date & time: 06/16/19  2030     History Chief Complaint  Patient presents with  . Urinary Frequency    Lance Warner is a 48 y.o. male.  Presents to the ER for evaluation of pain with urination onset 2 days ago.  Described as burning.  Associated with low midline abdominal pain, fever of 103 earlier this morning.  He took Tylenol around 6 PM prior to arrival.  Fever of 100.4 on arrival to ER. Reports midline low back pain but states this is chronic. No flank pain. Denies any other symptoms including nausea, vomiting, testicular pain, penile discharge.  Has not been sexually active in many years.  Concerned about an STD.  No history of kidney stones.  States 2 years ago had similar symptoms and came to the ER and was told he had a kidney infection.   HPI     Past Medical History:  Diagnosis Date  . GERD (gastroesophageal reflux disease)   . Hyperlipidemia   . Polyp of gallbladder   . Vitamin D deficiency     Patient Active Problem List   Diagnosis Date Noted  . Thoracic radiculopathy 08/12/2013  . Elevated serum creatinine 07/15/2013  . Physical exam, annual 07/05/2013  . Screening for prostate cancer 07/05/2013  . Screening examination for infectious disease 07/05/2013  . HLD (hyperlipidemia) 07/05/2013  . Unspecified vitamin D deficiency 07/05/2013  . Arthritis   . GERD (gastroesophageal reflux disease) 05/07/2011    History reviewed. No pertinent surgical history.     Family History  Problem Relation Age of Onset  . Peripheral vascular disease Mother   . Hypertension Mother   . Vision loss Father   . Cancer Sister   . Migraines Sister        states died from aneyrysm    Social History   Tobacco Use  . Smoking status: Never Smoker  . Smokeless tobacco: Never Used  Substance Use Topics  . Alcohol use: No  . Drug use: No    Home Medications Prior to Admission  medications   Medication Sig Start Date End Date Taking? Authorizing Provider  cephALEXin (KEFLEX) 500 MG capsule Take 1 capsule (500 mg total) by mouth 4 (four) times daily. 06/16/19   Liberty Handy, PA-C  Cholecalciferol (VITAMIN D) 2000 UNITS CAPS Take 2 capsules (4,000 Units total) by mouth daily. 07/15/13   Ileana Ladd, MD  esomeprazole (NEXIUM) 40 MG capsule Take 40 mg by mouth daily before breakfast.      [provider]  methocarbamol (ROBAXIN) 500 MG tablet Take 1 tablet (500 mg total) by mouth 2 (two) times daily. 10/05/17   Maxwell Caul, PA-C  ondansetron (ZOFRAN ODT) 4 MG disintegrating tablet Take 1 tablet (4 mg total) by mouth every 8 (eight) hours as needed for nausea or vomiting. 06/17/16   Alvira Monday, MD  pantoprazole (PROTONIX) 40 MG tablet Take 40 mg by mouth daily.    [provider]  phenazopyridine (PYRIDIUM) 200 MG tablet Take 1 tablet (200 mg total) by mouth 3 (three) times daily. 06/16/19   Liberty Handy, PA-C    Allergies    Pork-derived products  Review of Systems   Review of Systems  Genitourinary: Positive for dysuria.  Musculoskeletal: Positive for back pain.  All other systems reviewed and are negative.   Physical Exam Updated Vital Signs BP (!) 135/105 (BP Location: Left Arm)   Pulse Marland Kitchen)  114   Temp (!) 100.4 F (38 C) (Oral)   Resp 20   Ht 5\' 8"  (1.727 m)   Wt 100.7 kg   SpO2 99%   BMI 33.75 kg/m   Physical Exam Vitals and nursing note reviewed.  Constitutional:      Appearance: He is well-developed.     Comments: Non toxic.  HENT:     Head: Normocephalic and atraumatic.     Nose: Nose normal.  Eyes:     Conjunctiva/sclera: Conjunctivae normal.  Cardiovascular:     Rate and Rhythm: Normal rate and regular rhythm.     Heart sounds: Normal heart sounds.  Pulmonary:     Effort: Pulmonary effort is normal.     Breath sounds: Normal breath sounds.  Abdominal:     General: Bowel sounds are normal.      Palpations: Abdomen is soft.     Tenderness: There is abdominal tenderness in the suprapubic area.     Comments: Soft, no guarding, rigidity or rebound.  No CVA tenderness.  Genitourinary:    Comments:   External genitalia normal without erythema, edema, tenderness or lesions. Non circumcised male.  No groin lymphadenopathy. No meatus discharge.  Glans and shaft smooth without tenderness, lesions, masses or deformity. Scrotum without lesions or edema.  Non tender testicles. Epididymis and spermatic cord without tenderness or masses, bilaterally. Musculoskeletal:        General: Normal range of motion.     Cervical back: Normal range of motion.     Lumbar back: Tenderness present.       Back:     Comments: C-spine: No midline or paraspinal muscle tenderness.  No CVA tenderness.  L-spine: Mild midline lumbar tenderness.  No paraspinal muscle tenderness.  Full range of motion of lower extremities without pain.  Negative SLR bilaterally.  Skin:    General: Skin is warm and dry.     Capillary Refill: Capillary refill takes less than 2 seconds.  Neurological:     Mental Status: He is alert.     Comments: Sensation and strength intact in lower extremities.  Psychiatric:        Behavior: Behavior normal.     ED Results / Procedures / Treatments   Labs (all labs ordered are listed, but only abnormal results are displayed) Labs Reviewed  URINALYSIS, ROUTINE W REFLEX MICROSCOPIC - Abnormal; Notable for the following components:      Result Value   Specific Gravity, Urine <1.005 (*)    Hgb urine dipstick TRACE (*)    Leukocytes,Ua MODERATE (*)    All other components within normal limits  URINALYSIS, MICROSCOPIC (REFLEX) - Abnormal; Notable for the following components:   Bacteria, UA RARE (*)    All other components within normal limits  URINE CULTURE  CULTURE, BLOOD (ROUTINE X 2)  CULTURE, BLOOD (ROUTINE X 2)  CBC WITH DIFFERENTIAL/PLATELET  BASIC METABOLIC PANEL     EKG None  Radiology No results found.  Procedures Procedures (including critical care time)  Medications Ordered in ED Medications  sodium chloride 0.9 % bolus 1,000 mL (has no administration in time range)  cefTRIAXone (ROCEPHIN) 1 g in sodium chloride 0.9 % 100 mL IVPB (has no administration in time range)  ibuprofen (ADVIL) tablet 600 mg (has no administration in time range)    ED Course  I have reviewed the triage vital signs and the nursing notes.  Pertinent labs & imaging results that were available during my care of the patient were  reviewed by me and considered in my medical decision making (see chart for details).  Clinical Course as of Jun 15 2320  Wed Jun 16, 2019  2157 Temp(!): 100.4 F (38 C) [CG]  2157 Pulse Rate(!): 114 [CG]  2206 Temp(!): 100.4 F (38 C) [CG]  2209 Pulse Rate(!): 114 [CG]  2209 Glori LuisLeukocytes,Ua(!): MODERATE [CG]  2209 Bacteria, UA(!): RARE [CG]  2209 WBC, UA: 21-50 [CG]    Clinical Course User Index [CG] Liberty HandyGibbons, Damarious Holtsclaw J, PA-C   MDM Rules/Calculators/A&P                      48 year old male with history of pyelonephritis presents with dysuria, suprapubic abdominal pain, fever.  Reports midline low back pain but on chart review he has history of this in the past and has gone to PT for it.  He has reproducible midline lumbar tenderness but neurovascularly intact.  I think this back pain is chronic.  I doubt epidural abscess, cauda equina.  No CVA tenderness.  Low-grade fever and tachycardia of 114 on arrival but overall well-appearing, nontoxic.  Suspect tachycardia is related to low-grade fever and pain.  He does not appear septic or critically ill.  Given initial vitals however, will obtain screening labs, send blood and urine cultures.  IV fluids, Rocephin and ibuprofen.  2315: Plan of care discussed with EDP.  We will plan on labs, IV fluids and Rocephin here.  Anticipate discharge with Keflex and Pyridium.   Pyelonephritis/recurrent UTI uncommon in young men, will also recommend PCP f/u for more discussion of this. This plan was discussed with patient who is hesitant to stay for long period of time in the ER but is amenable at this time for medical treatment and IV fluids and antibiotics.  Will handoff to oncoming ED PA who will follow up on screening labs and discharge. Final Clinical Impression(s) / ED Diagnoses Final diagnoses:  Pyelonephritis    Rx / DC Orders ED Discharge Orders         Ordered    cephALEXin (KEFLEX) 500 MG capsule  4 times daily     06/16/19 2314    phenazopyridine (PYRIDIUM) 200 MG tablet  3 times daily     06/16/19 2314           Liberty HandyGibbons, Adryan Druckenmiller J, PA-C 06/16/19 2314    Liberty HandyGibbons, Abisai Deer J, PA-C 06/16/19 2321    Virgina Norfolkuratolo, Adam, DO 06/16/19 2332

## 2019-06-16 NOTE — ED Provider Notes (Signed)
Care assumed from [place], [place].  Patient with fever and urinary symptoms and urine showing evidence of infection.  He has received IV ceftriaxone for the UTI.  He was initially febrile and tachycardic and has been given ibuprofen and IV fluids with improvement in heart rate and temperature.  He is nontoxic in appearance.  Labs also show mild renal insufficiency which is unchanged from baseline.  He will be discharged with prescription for cephalexin.  Recommended follow-up with PCP and consider urology referral to evaluate why he has had recurrent UTIs.  Results for orders placed or performed during the hospital encounter of 06/16/19  Urinalysis, Routine w reflex microscopic- may I&O cath if menses  Result Value Ref Range   Color, Urine YELLOW YELLOW   APPearance CLEAR CLEAR   Specific Gravity, Urine <1.005 (L) 1.005 - 1.030   pH 6.0 5.0 - 8.0   Glucose, UA NEGATIVE NEGATIVE mg/dL   Hgb urine dipstick TRACE (A) NEGATIVE   Bilirubin Urine NEGATIVE NEGATIVE   Ketones, ur NEGATIVE NEGATIVE mg/dL   Protein, ur NEGATIVE NEGATIVE mg/dL   Nitrite NEGATIVE NEGATIVE   Leukocytes,Ua MODERATE (A) NEGATIVE  Urinalysis, Microscopic (reflex)  Result Value Ref Range   RBC / HPF 0-5 0 - 5 RBC/hpf   WBC, UA 21-50 0 - 5 WBC/hpf   Bacteria, UA RARE (A) NONE SEEN   Squamous Epithelial / LPF 0-5 0 - 5  CBC with Differential  Result Value Ref Range   WBC 14.2 (H) 4.0 - 10.5 K/uL   RBC 5.87 (H) 4.22 - 5.81 MIL/uL   Hemoglobin 15.5 13.0 - 17.0 g/dL   HCT 48.5 39.0 - 52.0 %   MCV 82.6 80.0 - 100.0 fL   MCH 26.4 26.0 - 34.0 pg   MCHC 32.0 30.0 - 36.0 g/dL   RDW 16.9 (H) 11.5 - 15.5 %   Platelets 301 150 - 400 K/uL   nRBC 0.0 0.0 - 0.2 %   Neutrophils Relative % 83 %   Neutro Abs 11.6 (H) 1.7 - 7.7 K/uL   Lymphocytes Relative 11 %   Lymphs Abs 1.6 0.7 - 4.0 K/uL   Monocytes Relative 6 %   Monocytes Absolute 0.9 0.1 - 1.0 K/uL   Eosinophils Relative 0 %   Eosinophils Absolute 0.0 0.0 - 0.5 K/uL    Basophils Relative 0 %   Basophils Absolute 0.0 0.0 - 0.1 K/uL   Immature Granulocytes 0 %   Abs Immature Granulocytes 0.05 0.00 - 0.07 K/uL  Basic metabolic panel  Result Value Ref Range   Sodium 138 135 - 145 mmol/L   Potassium 3.9 3.5 - 5.1 mmol/L   Chloride 101 98 - 111 mmol/L   CO2 25 22 - 32 mmol/L   Glucose, Bld 109 (H) 70 - 99 mg/dL   BUN 12 6 - 20 mg/dL   Creatinine, Ser 1.38 (H) 0.61 - 1.24 mg/dL   Calcium 9.3 8.9 - 10.3 mg/dL   GFR calc non Af Amer >60 >60 mL/min   GFR calc Af Amer >60 >60 mL/min   Anion gap 12 5 - 15      Delora Fuel, MD 62/69/48 2357

## 2019-06-17 MED ORDER — CEPHALEXIN 500 MG PO CAPS: 500.0000 mg | ORAL_CAPSULE | Freq: Four times a day (QID) | ORAL | 0 refills | Status: DC

## 2019-06-17 MED ORDER — PHENAZOPYRIDINE HCL 200 MG PO TABS: 200.0000 mg | ORAL_TABLET | Freq: Three times a day (TID) | ORAL | 0 refills | Status: DC

## 2019-06-18 LAB — URINE CULTURE: Culture: <10000 — AB

## 2019-06-22 LAB — CULTURE, BLOOD (ROUTINE X 2)
Culture: NO GROWTH
Culture: NO GROWTH
Special Requests: ADEQUATE
Special Requests: ADEQUATE

## 2020-03-10 ENCOUNTER — Other Ambulatory Visit: Payer: Self-pay | Admitting: Family Medicine

## 2020-03-10 DIAGNOSIS — R112 Nausea with vomiting, unspecified: Secondary | ICD-10-CM

## 2020-03-23 ENCOUNTER — Ambulatory Visit
Admission: RE | Admit: 2020-03-23 | Discharge: 2020-03-23 | Disposition: A | Discharge status: Home / Self Care | Payer: 59 | Source: Ambulatory Visit | Attending: Family Medicine | Admitting: Family Medicine

## 2020-03-23 DIAGNOSIS — R112 Nausea with vomiting, unspecified: Secondary | ICD-10-CM

## 2022-09-05 ENCOUNTER — Emergency Department (HOSPITAL_BASED_OUTPATIENT_CLINIC_OR_DEPARTMENT_OTHER): Payer: 59

## 2022-09-05 ENCOUNTER — Encounter (HOSPITAL_BASED_OUTPATIENT_CLINIC_OR_DEPARTMENT_OTHER): Payer: Self-pay | Admitting: Pediatrics

## 2022-09-05 ENCOUNTER — Other Ambulatory Visit: Payer: Self-pay

## 2022-09-05 ENCOUNTER — Emergency Department (HOSPITAL_BASED_OUTPATIENT_CLINIC_OR_DEPARTMENT_OTHER)
Admission: EM | Admit: 2022-09-05 | Discharge: 2022-09-05 | Disposition: A | Discharge status: Home / Self Care | Payer: 59 | Attending: Emergency Medicine | Admitting: Emergency Medicine

## 2022-09-05 DIAGNOSIS — R7989 Other specified abnormal findings of blood chemistry: Secondary | ICD-10-CM

## 2022-09-05 DIAGNOSIS — R112 Nausea with vomiting, unspecified: Secondary | ICD-10-CM

## 2022-09-05 DIAGNOSIS — R0602 Shortness of breath: Secondary | ICD-10-CM | POA: Diagnosis not present

## 2022-09-05 DIAGNOSIS — R944 Abnormal results of kidney function studies: Secondary | ICD-10-CM | POA: Insufficient documentation

## 2022-09-05 DIAGNOSIS — K760 Fatty (change of) liver, not elsewhere classified: Secondary | ICD-10-CM | POA: Insufficient documentation

## 2022-09-05 DIAGNOSIS — R109 Unspecified abdominal pain: Secondary | ICD-10-CM | POA: Diagnosis not present

## 2022-09-05 DIAGNOSIS — R42 Dizziness and giddiness: Secondary | ICD-10-CM | POA: Insufficient documentation

## 2022-09-05 LAB — LIPASE, BLOOD: Lipase: 26 U/L (ref 11–51)

## 2022-09-05 LAB — COMPREHENSIVE METABOLIC PANEL WITH GFR
ALT: 43 U/L (ref 0–44)
AST: 30 U/L (ref 15–41)
Albumin: 4.2 g/dL (ref 3.5–5.0)
Alkaline Phosphatase: 79 U/L (ref 38–126)
Anion gap: 8 (ref 5–15)
BUN: 16 mg/dL (ref 6–20)
CO2: 26 mmol/L (ref 22–32)
Calcium: 8.8 mg/dL — ABNORMAL LOW (ref 8.9–10.3)
Chloride: 101 mmol/L (ref 98–111)
Creatinine, Ser: 1.37 mg/dL — ABNORMAL HIGH (ref 0.61–1.24)
GFR, Estimated: >60 mL/min (ref >60)
Glucose, Bld: 127 mg/dL — ABNORMAL HIGH (ref 70–99)
Potassium: 3.5 mmol/L (ref 3.5–5.1)
Sodium: 135 mmol/L (ref 135–145)
Total Bilirubin: 0.9 mg/dL (ref 0.3–1.2)
Total Protein: 7.6 g/dL (ref 6.5–8.1)

## 2022-09-05 LAB — CBC
HCT: 45.3 % (ref 39.0–52.0)
Hemoglobin: 15.1 g/dL (ref 13.0–17.0)
MCH: 26.8 pg (ref 26.0–34.0)
MCHC: 33.3 g/dL (ref 30.0–36.0)
MCV: 80.3 fL (ref 80.0–100.0)
Platelets: 312 K/uL (ref 150–400)
RBC: 5.64 MIL/uL (ref 4.22–5.81)
RDW: 15.5 % (ref 11.5–15.5)
WBC: 6.4 K/uL (ref 4.0–10.5)
nRBC: 0 % (ref 0.0–0.2)

## 2022-09-05 LAB — URINALYSIS, ROUTINE W REFLEX MICROSCOPIC
Bilirubin Urine: NEGATIVE
Glucose, UA: NEGATIVE mg/dL
Hgb urine dipstick: NEGATIVE
Ketones, ur: NEGATIVE mg/dL
Leukocytes,Ua: NEGATIVE
Nitrite: NEGATIVE
Protein, ur: NEGATIVE mg/dL
Specific Gravity, Urine: 1.01 (ref 1.005–1.030)
pH: 6 (ref 5.0–8.0)

## 2022-09-05 MED ORDER — SODIUM CHLORIDE 0.9 % IV BOLUS
1000.0000 mL | Freq: Once | INTRAVENOUS | Status: AC
  Administered 2022-09-05: 1000 mL via INTRAVENOUS

## 2022-09-05 MED ORDER — ONDANSETRON HCL 8 MG PO TABS: 8.0000 mg | ORAL_TABLET | Freq: Once | ORAL | 0 refills | Status: AC

## 2022-09-05 MED ORDER — ONDANSETRON HCL 4 MG/2ML IJ SOLN
4.0000 mg | Freq: Once | INTRAMUSCULAR | Status: AC
  Administered 2022-09-05: 4 mg via INTRAVENOUS
  Filled 2022-09-05: qty 2

## 2022-09-05 NOTE — ED Provider Notes (Signed)
[place]GENC[facility] DEPARTMENT [facility] Provider Note   CSN: [id number] Arrival date & time: 09/05/22  1801     History  Chief Complaint  Patient presents with   Abdominal Pain   Shortness of Breath   Dizziness   Nausea    Lance Warner is a 52 y.o. male.   Abdominal Pain Associated symptoms: shortness of breath   Shortness of Breath Associated symptoms: abdominal pain   Dizziness Associated symptoms: shortness of breath      This is a 61-year-old male presenting to the emergency department due to abdominal pain, nausea and vomiting.  Symptoms started 4 days ago, he was bending over and felt short of breath which has since resolved.  He started having right flank pain at that time.  Pain has been constant, worse with movement.  Also worse after eating.  States he is vomited multiple times secondary to nausea and.  He has been having intermittent vertigo described as the room is spinning, he had an episode of this 10 years ago but has not had any since.  Denies any headache, lateralized weakness or numbness, chest pain, shortness of breath, dysuria, hematuria.  History of gallstones, no previous abdominal surgeries.  Not having any blood in his stool or his urine.  Home Medications Prior to Admission medications   Medication Sig Start Date End Date Taking? Authorizing Provider  cephALEXin (KEFLEX) 500 MG capsule Take 1 capsule (500 mg total) by mouth 4 (four) times daily. [id number]   Delora Fuel, MD  Cholecalciferol (VITAMIN D) 2000 UNITS CAPS Take 2 capsules (4,000 Units total) by mouth daily. 07/15/13   Vernie Shanks, MD  esomeprazole (NEXIUM) 40 MG capsule Take 40 mg by mouth daily before breakfast.      [provider]  methocarbamol (ROBAXIN) 500 MG tablet Take 1 tablet (500 mg total) by mouth 2 (two) times daily. 10/05/17   Volanda Napoleon, PA-C  ondansetron (ZOFRAN ODT) 4 MG disintegrating tablet Take 1 tablet (4 mg total) by mouth every 8  (eight) hours as needed for nausea or vomiting. 06/17/16   Gareth Morgan, MD  pantoprazole (PROTONIX) 40 MG tablet Take 40 mg by mouth daily.    [provider]  phenazopyridine (PYRIDIUM) 200 MG tablet Take 1 tablet (200 mg total) by mouth 3 (three) times daily. [id number]   Delora Fuel, MD      Allergies    Pork-derived products    Review of Systems   Review of Systems  Respiratory:  Positive for shortness of breath.   Gastrointestinal:  Positive for abdominal pain.  Neurological:  Positive for dizziness.    Physical Exam Updated Vital Signs BP (!) 153/87 (BP Location: Left Arm)   Pulse 97   Temp 98.2 F (36.8 C) (Oral)   Resp 18   Ht '5\' 8"'$  (1.727 m)   Wt 97.5 kg   SpO2 99%   BMI 32.69 kg/m  Physical Exam Vitals and nursing note reviewed. Exam conducted with a chaperone present.  Constitutional:      Appearance: Normal appearance.  HENT:     Head: Normocephalic and atraumatic.  Eyes:     General: No scleral icterus.       Right eye: No discharge.        Left eye: No discharge.     Extraocular Movements: Extraocular movements intact.     Pupils: Pupils are equal, round, and reactive to light.  Cardiovascular:     Rate and Rhythm:  Normal rate and regular rhythm.     Pulses: Normal pulses.     Heart sounds: Normal heart sounds. No murmur heard.    No friction rub. No gallop.  Pulmonary:     Effort: Pulmonary effort is normal. No respiratory distress.     Breath sounds: Normal breath sounds.  Abdominal:     General: Abdomen is flat. Bowel sounds are normal. There is no distension.     Palpations: Abdomen is soft.     Tenderness: There is abdominal tenderness in the suprapubic area. There is right CVA tenderness.  Skin:    General: Skin is warm and dry.     Coloration: Skin is not jaundiced.  Neurological:     Mental Status: He is alert. Mental status is at baseline.     Coordination: Coordination normal.     Comments: Cranial nerves II through XII are  grossly intact right upper and lower extremity strength symmetric bilaterally 5/5 against assistance.  No dysarthria     ED Results / Procedures / Treatments   Labs (all labs ordered are listed, but only abnormal results are displayed) Labs Reviewed  COMPREHENSIVE METABOLIC PANEL - Abnormal; Notable for the following components:      Result Value   Glucose, Bld 127 (*)    Creatinine, Ser 1.37 (*)    Calcium 8.8 (*)    All other components within normal limits  LIPASE, BLOOD  CBC  URINALYSIS, ROUTINE W REFLEX MICROSCOPIC    EKG None  Radiology No results found.  Procedures Procedures    Medications Ordered in ED Medications  sodium chloride 0.9 % bolus 1,000 mL (has no administration in time range)  ondansetron (ZOFRAN) injection 4 mg (has no administration in time range)    ED Course/ Medical Decision Making/ A&P                             Medical Decision Making Amount and/or Complexity of Data Reviewed Labs: ordered. Radiology: ordered.  Risk Prescription drug management.   Patient is a 52 year old male without chronic comorbidities presenting to the emergency department due to multiple complaints but primarily right flank pain, nausea, vomiting and vertigo.  Differential is broad and includes electrolyte derangement, AKI, dehydration, sepsis, nephrolithiasis, pyelonephritis, UTI, dissection, atypical ACS, dissection, CVA/TIA, BPPV, arrhythmia  I viewed external medical records, not on any current medication.  Patient's wife is at bedside providing independent history. Physical exam is not revealing, there is some mild CVA tenderness but no pulse deficit, no focal deficits, no rash or signs of shingles.  She does not meet any SIRS criteria and does not appear septic.  I do not think this presentation is consistent with a dissection given lack of chest pain and shortness of breath other than the initial onset I do not think this is a PE, pneumonia, ACS.  EKG shows  sinus rhythm without any acute process or underlying arrhythmia on cardiac monitoring.  Will treat initially with fluids, antiemetics and check labs, CT renal study.  I ordered and reviewed interpreted laboratory workup.  UA is negative for underlying UTI, CMP shows baseline renal impairment without any gross electrolyte derangement or transaminitis.  Lipase is within normal limits, not consistent with pancreatitis.  CBC unremarkable without leukocytosis or anemia.  I considered gallbladder etiology but there is no right upper quadrant tenderness on exam, no Murphy sign and no cholelithiasis on most recent scan so I think that is  unlikely.  CT renal study is negative for acute process, agree with radiologist  I consider getting a CT of the abdomen but based on remaining of workup being reassuring I do not really think it would show much.    CT head was ordered given nausea and vomiting, no obvious intracranial mass or pathology noted.  I do not think he really needs an MRI given normal neuroexam, unclear etiology of symptoms possible viral?  Discussed with attending, will have him follow-up closely with his PCP.  Antiemetic prescribed, strict return precautions discussed.        Final Clinical Impression(s) / ED Diagnoses Final diagnoses:  None    Rx / DC Orders ED Discharge Orders     None         Sherrill Raring, Hershal Coria 09/05/22 2309    Drenda Freeze, MD 09/06/22 [phone]

## 2022-09-05 NOTE — ED Triage Notes (Signed)
C/O shortness of breath started a few days ago. C/O Vertigo & nausea started today.  And pain on right side worst the last two days; Reports hx of gallbladder issues and some symptoms occurs after eating.

## 2022-09-05 NOTE — Discharge Instructions (Signed)
You are seen today in the emergency department due to flank pain.  Your workup today was reassuring, no kidney stone.  You have a slight fatty liver and your creatinine was elevated which is a marker of kidney function, this needs to be followed up by primary.  Information above to consult appoint with a new primary care doctor is Dr. Ronalee Red is retired, please see the lab in the next week, call tomorrow to schedule close follow-up outpatient.  Return to the emergency department if you are unable to eat or drink without vomiting, severe abdominal pain, chest pain, shortness of breath, weakness one-sided your body, loss of balance, loss of vision, severe headache, fevers, new or concerning symptoms.

## 2022-09-05 NOTE — ED Notes (Signed)
Patient transported to CT 

## 2022-10-03 ENCOUNTER — Ambulatory Visit: Payer: 59 | Attending: Family Medicine | Admitting: Physical Therapy

## 2022-10-03 ENCOUNTER — Encounter: Payer: Self-pay | Admitting: Physical Therapy

## 2022-10-03 VITALS — BP 148/100 | HR 90

## 2022-10-03 DIAGNOSIS — R42 Dizziness and giddiness: Secondary | ICD-10-CM | POA: Diagnosis present

## 2022-10-03 NOTE — Therapy (Signed)
OUTPATIENT PHYSICAL THERAPY VESTIBULAR EVALUATION  Patient Name: Lance Warner MRN: [id number] DOB:Nov 04, 1970, 52 y.o., male Today's Date: 10/04/2022  END OF SESSION:  PT End of Session - 10/03/22 1624     Visit Number 1    Number of Visits 5   including eval   Date for PT Re-Evaluation 11/29/22   extended re-eval day as paitent working on BP management   Authorization Type [employer]    PT Start Time 1620    PT Stop Time 1703    PT Time Calculation (min) 43 min    Equipment Utilized During Treatment Gait belt    Activity Tolerance Patient tolerated treatment well    Behavior During Therapy WFL for tasks assessed/performed             Past Medical History:  Diagnosis Date   GERD (gastroesophageal reflux disease)    Hyperlipidemia    Polyp of gallbladder    Vitamin D deficiency    History reviewed. No pertinent surgical history. Patient Active Problem List   Diagnosis Date Noted   Thoracic radiculopathy 08/12/2013   Elevated serum creatinine 07/15/2013   Physical exam, annual 07/05/2013   Screening for prostate cancer 07/05/2013   Screening examination for infectious disease 07/05/2013   HLD (hyperlipidemia) 07/05/2013   Unspecified vitamin D deficiency 07/05/2013   Arthritis    GERD (gastroesophageal reflux disease) 05/07/2011    PCP: Orpha Bur, MD REFERRING PROVIDER: Orpha Bur, MD  REFERRING DIAG:  Diagnosis  H81.10 (ICD-10-CM) - BPPV (benign paroxysmal positional vertigo)   THERAPY DIAG:  Dizziness and giddiness  ONSET DATE: 09/18/2022 (referral)  Rationale for Evaluation and Treatment: Rehabilitation  SUBJECTIVE:   SUBJECTIVE STATEMENT: Patient reports that he has multiple episodes of nausea and spinning eyes. Patient reports that his dizziness typically lasts for a few minutes; patient feels like when he holds his nose, his dizziness goes away. Patient reports that he used to get this feeling when he was a little kid but  just experienced a new onset of these symptoms about 1 month ago. Patient reports that he gets dizzy episodes when he is walking or sometimes when he is driving. He also notices it when he is just sitting. Patient is unsure if it is associated with positional changes/ Patient reports that he never experiences symptoms when laying back in bed or sitting up. Patient reports that he has experienced some stinging and some water in his ears.   Pt accompanied by: self  PERTINENT HISTORY: hepatic steatosis, GERD  PAIN:  Are you having pain? No  PRECAUTIONS: Fall  WEIGHT BEARING RESTRICTIONS: No  FALLS: Has patient fallen in last 6 months? No  LIVING ENVIRONMENT: Lives with: lives with their family Lives in: House/apartment Stairs: No Has following equipment at home: None  PLOF: Independent  PATIENT GOALS: "To fix my ears, told that you would help out the water in my ears."  OBJECTIVE:   Vitals:   10/03/22 1634 10/03/22 1707  BP: (!) 159/97 (!) 148/100  Pulse: 99 90   Vitals assessed during visit; given elevated readings held positional testing in today's session. Recommended patient call PCP following session to discuss need for starting on BP management and to go to ED if became symptomatic. Patient verbalized understanding. Patient denies symptoms at time of session. All testing performed in seated position at rest to accommodate vitals. Assessed at end of session and given continued elevated diastolic reading continued to recommend follow up.   DIAGNOSTIC FINDINGS:  CT HEAD WO CONTRAST on 09/05/2022: IMPRESSION: No acute intracranial process.  CT RENAL STONE STUDY 09/05/2022: IMPRESSION: 1. No renal calculus or obstructive uropathy bilaterally. 2. Hepatic steatosis.  COGNITION: Overall cognitive status: Within functional limits for tasks assessed   SENSATION: WFL  EDEMA:  None  POSTURE:  rounded shoulders and forward head  Cervical ROM:    Active A/PROM  (deg) eval  Flexion WNL  Extension WNL  Right lateral flexion WNL  Left lateral flexion WNL  Right rotation WNL  Left rotation WNL  (Blank rows = not tested)  TRANSFERS: Assistive device utilized:  None   Sit to stand: Complete Independence Stand to sit: Complete Independence Chair to chair: Complete Independence  GAIT: Gait pattern: Grossly WNL, no significant signs of imbalance noted Distance walked: 2 x 80' Assistive device utilized: None Level of assistance: Modified independence  FUNCTIONAL TESTS:  SOT: To be assessed  PATIENT SURVEYS:  FOTO Not captured due to patient arriving late to session; plan to assess at next visit  VESTIBULAR ASSESSMENT:  GENERAL OBSERVATION: Moves head without hesitation, ambulates without AD, no major signs of unsteadiness    SYMPTOM BEHAVIOR:  Subjective history: see above  Non-Vestibular symptoms: headaches and nausea/vomiting Type of dizziness: Imbalance (Disequilibrium), Spinning/Vertigo, Unsteady with head/body turns, and "World moves"  Frequency: several times in the last month, unsure exactly how long  Duration: a few minutes  Aggravating factors: Spontaneous  Relieving factors:  grabbing nose  - to prevent air from going into head  Progression of symptoms: better  OCULOMOTOR EXAM:  Ocular Alignment: normal  Ocular ROM: No Limitations  Spontaneous Nystagmus: absent  Gaze-Induced Nystagmus: absent  Smooth Pursuits: saccades  Saccades: dysmetria  Convergence/Divergence: 10 cm   Test of Skew: negative  VBI: negative  VESTIBULAR - OCULAR REFLEX:   Slow VOR: Comment: a few extra eye movements, denies dizziness, unclear direction  VOR Cancellation: Normal  Head-Impulse Test: HIT Right: negative HIT Left: negative  Dynamic Visual Acuity: To be assessed   POSITIONAL TESTING: HELD due to elevated BP  VESTIBULAR TREATMENT:                                                                                                     Initial Eval only  PATIENT EDUCATION: Education details: POC, examination findings, results, recommend follow up about BP Person educated: Patient Education method: Explanation Education comprehension: verbalized understanding and needs further education  HOME EXERCISE PROGRAM:  GOALS: Goals reviewed with patient? Yes  LONG TERM GOALS: Target date: 11/08/2022  Patient will report demonstrate independence with final HEP in order to maintain current gains and continue to progress after physical therapy discharge.   Baseline: To be provided as indicated Goal status: INITIAL  2.  Given controlled BP readings therapist will assess positional testing and write goal as indicated.  Baseline: To be assessed Goal status: INITIAL  3.  FOTO to be assessed/goal written as indicated Baseline: To be assessed Goal status: INITIAL  4.  MSQ to be assessed/goal written as indicated Baseline: To be assessed Goal status: INITIAL  5.  SOT  to be assessed/goal written as indicated Baseline: To be assessed Goal status: INITIAL  ASSESSMENT:  CLINICAL IMPRESSION: Patient is a 52 y.o. male who was seen today for physical therapy evaluation and treatment for possible BPPV. Given elevated BP readings, testing was limited in today's session to primarily subjective report and brief vestibular/oculomotor screen. Patient subjective report is inconsistent with BPPV in today's session but will assess next session as BP allows to fully rule out as indicated. Patient presents with mild occulomotor impairments including abnormal divergence and slow saccades but reports no parallel with subjective reports of dizziness. Patient was educated on following up with PCP about elevated BP readings and to go to ED if became symptomatic. Patient will benefit from skilled physical therapy services for further assessment and to address impairments as indicated to improve safety in home environment.    OBJECTIVE IMPAIRMENTS:  dizziness.   ACTIVITY LIMITATIONS: sitting, standing, and stairs  PARTICIPATION LIMITATIONS: community activity  PERSONAL FACTORS: Time since onset of injury/illness/exacerbation and 1 comorbidity: see above  are also affecting patient's functional outcome.   REHAB POTENTIAL: Fair Given patient uncontrolled BP readings will require management in this area in order to appropriate tolerate PT  CLINICAL DECISION MAKING: Stable/uncomplicated  EVALUATION COMPLEXITY: Low   PLAN:  PT FREQUENCY: 1-2x/week  PT DURATION: 4 weeks  PLANNED INTERVENTIONS: Therapeutic exercises, Therapeutic activity, Neuromuscular re-education, Balance training, Gait training, Patient/Family education, Self Care, Vestibular training, Canalith repositioning, Manual therapy, and Re-evaluation  PLAN FOR NEXT SESSION: Check vitals, assess positional testing (as able), assess MSQ, DVA, SOT and write goals as indicated   Carmelia Bake, PT, DPT 10/04/2022, 8:12 AM

## 2022-10-15 ENCOUNTER — Ambulatory Visit: Payer: 59 | Admitting: Physical Therapy

## 2022-10-18 ENCOUNTER — Ambulatory Visit: Payer: 59 | Admitting: Physical Therapy

## 2022-10-18 ENCOUNTER — Encounter: Payer: Self-pay | Admitting: Physical Therapy

## 2022-10-18 VITALS — BP 146/107 | HR 87

## 2022-10-18 DIAGNOSIS — R42 Dizziness and giddiness: Secondary | ICD-10-CM

## 2022-10-18 NOTE — Therapy (Signed)
[facility] [address] [place], [place], [address] Phone: [phone]   Fax:  [phone]  Patient Details  Name: Lance Warner MRN: [id number] Date of Birth: May 04, 1971 Referring Provider:  Orpha Bur, MD  Encounter Date: 10/18/2022  Session arrive no charge; patient BP continues to be out of range and unclear if patient has followed up with PCP. Provides conflicting subjective as to what range his BP usually runs but states he does not want to take medication. Discussed need to follow up with PCP in order to get medically managed before doing additional vestibular testing. Discussed signs of stroke and when to go to ED. Patient was unclear if he would follow up with PCP even when offering to provide resources; patient politely refused.   Vitals:   10/18/22 0810  BP: (!) 146/107  Pulse: 87    Subjective Gathered prior to leaving: Patient continues to reports that he gets brief dizziness that lasts for brief periods when patient holds his nose (not blowing nose). Patient has a difficult time explaining symptoms but states he feels like he has to sit down. Experiences symptoms sometimes when driving, sitting down, or standing. Is unsure if it feels like things are moving. Patient reports that he gets the experience every couple of days and sometimes every few hours. Patient reports that he has increased difficulty hearing out of L ear and occasionally out of R side.  Patient denies any falls/near falls.    Given possible unilateral hearing loss, in addition to lack of true positional signs reported in subjective, patient may benefit from ENT consult as well to rule out additional pathologies. Will follow up as able.   Carmelia Bake, PT, DPT 10/18/2022, 8:28 AM  [facility] [address] [place], [place], [address] Phone: [phone]   Fax:  [phone]

## 2022-10-24 ENCOUNTER — Encounter: Payer: 59 | Admitting: Physical Therapy

## 2023-09-09 ENCOUNTER — Emergency Department (HOSPITAL_BASED_OUTPATIENT_CLINIC_OR_DEPARTMENT_OTHER)
Admission: EM | Admit: 2023-09-09 | Discharge: 2023-09-10 | Disposition: A | Discharge status: Home / Self Care | Attending: Emergency Medicine | Admitting: Emergency Medicine

## 2023-09-09 ENCOUNTER — Encounter (HOSPITAL_BASED_OUTPATIENT_CLINIC_OR_DEPARTMENT_OTHER): Payer: Self-pay | Admitting: Emergency Medicine

## 2023-09-09 ENCOUNTER — Other Ambulatory Visit: Payer: Self-pay

## 2023-09-09 ENCOUNTER — Emergency Department (HOSPITAL_BASED_OUTPATIENT_CLINIC_OR_DEPARTMENT_OTHER)

## 2023-09-09 DIAGNOSIS — R1013 Epigastric pain: Secondary | ICD-10-CM | POA: Diagnosis present

## 2023-09-09 LAB — CBC
HCT: 45.8 % (ref 39.0–52.0)
Hemoglobin: 15.1 g/dL (ref 13.0–17.0)
MCH: 26.9 pg (ref 26.0–34.0)
MCHC: 33 g/dL (ref 30.0–36.0)
MCV: 81.6 fL (ref 80.0–100.0)
Platelets: 300 10*3/uL (ref 150–400)
RBC: 5.61 MIL/uL (ref 4.22–5.81)
RDW: 15.5 % (ref 11.5–15.5)
WBC: 5.5 10*3/uL (ref 4.0–10.5)
nRBC: 0 % (ref 0.0–0.2)

## 2023-09-09 LAB — TROPONIN I (HIGH SENSITIVITY): Troponin I (High Sensitivity): 3 ng/L (ref <18)

## 2023-09-09 LAB — COMPREHENSIVE METABOLIC PANEL
ALT: 36 U/L (ref 0–44)
AST: 21 U/L (ref 15–41)
Albumin: 4.6 g/dL (ref 3.5–5.0)
Alkaline Phosphatase: 85 U/L (ref 38–126)
Anion gap: 6 (ref 5–15)
BUN: 7 mg/dL (ref 6–20)
CO2: 29 mmol/L (ref 22–32)
Calcium: 9.1 mg/dL (ref 8.9–10.3)
Chloride: 102 mmol/L (ref 98–111)
Creatinine, Ser: 1.27 mg/dL — ABNORMAL HIGH (ref 0.61–1.24)
GFR, Estimated: >60 mL/min (ref >60)
Glucose, Bld: 129 mg/dL — ABNORMAL HIGH (ref 70–99)
Potassium: 3.8 mmol/L (ref 3.5–5.1)
Sodium: 137 mmol/L (ref 135–145)
Total Bilirubin: 0.6 mg/dL (ref 0.0–1.2)
Total Protein: 7.5 g/dL (ref 6.5–8.1)

## 2023-09-09 LAB — URINALYSIS, ROUTINE W REFLEX MICROSCOPIC
Bilirubin Urine: NEGATIVE
Glucose, UA: NEGATIVE mg/dL
Hgb urine dipstick: NEGATIVE
Ketones, ur: NEGATIVE mg/dL
Leukocytes,Ua: NEGATIVE
Nitrite: NEGATIVE
Protein, ur: NEGATIVE mg/dL
Specific Gravity, Urine: 1.01 (ref 1.005–1.030)
pH: 5.5 (ref 5.0–8.0)

## 2023-09-09 LAB — LIPASE, BLOOD: Lipase: 19 U/L (ref 11–51)

## 2023-09-09 MED ORDER — ALUM & MAG HYDROXIDE-SIMETH 200-200-20 MG/5ML PO SUSP
30.0000 mL | Freq: Once | ORAL | Status: AC
  Administered 2023-09-09: 30 mL via ORAL
  Filled 2023-09-09: qty 30

## 2023-09-09 MED ORDER — LIDOCAINE VISCOUS HCL 2 % MT SOLN
15.0000 mL | Freq: Once | OROMUCOSAL | Status: AC
  Administered 2023-09-09: 15 mL via OROMUCOSAL
  Filled 2023-09-09: qty 15

## 2023-09-09 NOTE — ED Notes (Signed)
 Re-evaluated in triage. Vital signs updated.   Pt denies any needs at this time. Updated and thanked for cooperation.

## 2023-09-09 NOTE — ED Notes (Signed)
 Patient notified of need for urine sample to complete eval/assessment. Specimen cup provided and instructions for clean catch given. Patient will notify staff when able to provide

## 2023-09-09 NOTE — ED Provider Notes (Signed)
 [facility] Provider Note   CSN: [id number] Arrival date & time: 09/09/23  1752     History {Add pertinent medical, surgical, social history, OB history to HPI:1} Chief Complaint  Patient presents with   Abdominal Pain    Lance Warner is a 53 y.o. male.  Patient states no medical history other than remote history of ulcers.  He is taking pantoprazole.  He is here with epigastric pain worsening over the past 3 weeks that comes and goes.  Worse with eating and lying flat.  Worse with drinking coffee.  Pain is in his epigastrium and spreads diffusely.  Nausea but no vomiting.  Burning goes up towards his throat and was worse after eating food.  No black or bloody stools.  No chest pain or shortness of breath.  No fever.  No pain with urination or blood in the urine.  States his last endoscopy showed no ulcers.  He is still taking pantoprazole.  Still has a gallbladder and appendix and he was worried this could be his gallbladder.  Denies any cardiac history.  The history is provided by the patient.  Abdominal Pain Associated symptoms: nausea   Associated symptoms: no chest pain, no cough, no dysuria, no fever, no hematuria, no shortness of breath and no vomiting        Home Medications Prior to Admission medications   Medication Sig Start Date End Date Taking? Authorizing Provider  cephALEXin (KEFLEX) 500 MG capsule Take 1 capsule (500 mg total) by mouth 4 (four) times daily. 06/17/19   Dione Booze, MD  Cholecalciferol (VITAMIN D) 2000 UNITS CAPS Take 2 capsules (4,000 Units total) by mouth daily. 07/15/13   Ileana Ladd, MD  esomeprazole (NEXIUM) 40 MG capsule Take 40 mg by mouth daily before breakfast.   Patient not taking: Reported on 10/03/2022    [provider]  meclizine (ANTIVERT) 12.5 MG tablet Take 12.5 mg by mouth 2 (two) times daily.    [provider]  methocarbamol (ROBAXIN) 500 MG tablet Take 1 tablet  (500 mg total) by mouth 2 (two) times daily. 10/05/17   Maxwell Caul, PA-C  pantoprazole (PROTONIX) 40 MG tablet Take 40 mg by mouth daily.    [provider]  phenazopyridine (PYRIDIUM) 200 MG tablet Take 1 tablet (200 mg total) by mouth 3 (three) times daily. Patient not taking: Reported on 10/03/2022 06/17/19   Dione Booze, MD      Allergies    Pork-derived products    Review of Systems   Review of Systems  Constitutional:  Positive for activity change and appetite change. Negative for fever.  HENT:  Negative for congestion and rhinorrhea.   Respiratory:  Positive for chest tightness. Negative for cough and shortness of breath.   Cardiovascular:  Negative for chest pain.  Gastrointestinal:  Positive for abdominal pain and nausea. Negative for vomiting.  Genitourinary:  Negative for dysuria and hematuria.  Musculoskeletal:  Negative for arthralgias and myalgias.  Skin:  Negative for rash.  Neurological:  Negative for dizziness, weakness and headaches.   all other systems are negative except as noted in the HPI and PMH.    Physical Exam Updated Vital Signs BP (!) 148/100   Pulse 74   Temp 97.9 F (36.6 C)   Resp 18   SpO2 98%  Physical Exam Vitals and nursing note reviewed.  Constitutional:      General: He is not in acute distress.    Appearance:  He is well-developed.  HENT:     Head: Normocephalic and atraumatic.     Mouth/Throat:     Pharynx: No oropharyngeal exudate.  Eyes:     Conjunctiva/sclera: Conjunctivae normal.     Pupils: Pupils are equal, round, and reactive to light.  Neck:     Comments: No meningismus. Cardiovascular:     Rate and Rhythm: Normal rate and regular rhythm.     Heart sounds: Normal heart sounds. No murmur heard. Pulmonary:     Effort: Pulmonary effort is normal. No respiratory distress.     Breath sounds: Normal breath sounds.  Abdominal:     Palpations: Abdomen is soft.     Tenderness: There is abdominal tenderness. There  is no guarding or rebound.     Comments: Epigastric tenderness, no lower abdominal tenderness, no guarding or rebound  Musculoskeletal:        General: No tenderness. Normal range of motion.     Cervical back: Normal range of motion and neck supple.  Skin:    General: Skin is warm.  Neurological:     Mental Status: He is alert and oriented to person, place, and time.     Cranial Nerves: No cranial nerve deficit.     Motor: No abnormal muscle tone.     Coordination: Coordination normal.     Comments:  5/5 strength throughout. CN 2-12 intact.Equal grip strength.   Psychiatric:        Behavior: Behavior normal.     ED Results / Procedures / Treatments   Labs (all labs ordered are listed, but only abnormal results are displayed) Labs Reviewed  COMPREHENSIVE METABOLIC PANEL - Abnormal; Notable for the following components:      Result Value   Glucose, Bld 129 (*)    Creatinine, Ser 1.27 (*)    All other components within normal limits  LIPASE, BLOOD  CBC  URINALYSIS, ROUTINE W REFLEX MICROSCOPIC  TROPONIN I (HIGH SENSITIVITY)    EKG EKG Interpretation Date/Time:  Tuesday September 09 2023 18:10:51 EDT Ventricular Rate:  87 PR Interval:  150 QRS Duration:  78 QT Interval:  344 QTC Calculation: 413 R Axis:   33  Text Interpretation: Normal sinus rhythm with sinus arrhythmia T wave abnormality, consider inferior ischemia Abnormal ECG When compared with ECG of 05-Sep-2022 18:13, PREVIOUS ECG IS PRESENT No significant change was found Confirmed by Glynn Octave [phone]) on 09/09/2023 11:19:34 PM  Radiology No results found.  Procedures Procedures  {Document cardiac monitor, telemetry assessment procedure when appropriate:1}  Medications Ordered in ED Medications  alum & mag hydroxide-simeth (MAALOX/MYLANTA) 200-200-20 MG/5ML suspension 30 mL (30 mLs Oral Given 09/09/23 2328)  lidocaine (XYLOCAINE) 2 % viscous mouth solution 15 mL (15 mLs Mouth/Throat Given 09/09/23 2328)     ED Course/ Medical Decision Making/ A&P   {   Click here for ABCD2, HEART and other calculatorsREFRESH Note before signing :1}                              Medical Decision Making Amount and/or Complexity of Data Reviewed Labs: ordered. Decision-making details documented in ED Course. Radiology: ordered and independent interpretation performed. Decision-making details documented in ED Course. ECG/medicine tests: ordered and independent interpretation performed. Decision-making details documented in ED Course.  Risk OTC drugs. Prescription drug management.   3 weeks of epigastric pain.  EKG shows T wave inversions similar to previous inferiorly and laterally.  Low suspicion for  ACS.  LFTs and lipase are normal.  Troponin is negative. Abdomen is soft without peritoneal signs  {Document critical care time when appropriate:1} {Document review of labs and clinical decision tools ie heart score, Chads2Vasc2 etc:1}  {Document your independent review of radiology images, and any outside records:1} {Document your discussion with family members, caretakers, and with consultants:1} {Document social determinants of health affecting pt's care:1} {Document your decision making why or why not admission, treatments were needed:1} Final Clinical Impression(s) / ED Diagnoses Final diagnoses:  None    Rx / DC Orders ED Discharge Orders     None

## 2023-09-09 NOTE — ED Triage Notes (Signed)
 Epigastric pain x 1 month, some nausea today, relieved with small sips of sprite Burning and it goes up toward throat, worse after eating food

## 2023-09-10 ENCOUNTER — Emergency Department (HOSPITAL_BASED_OUTPATIENT_CLINIC_OR_DEPARTMENT_OTHER)

## 2023-09-10 LAB — TROPONIN I (HIGH SENSITIVITY): Troponin I (High Sensitivity): 3 ng/L (ref <18)

## 2023-09-10 MED ORDER — PANTOPRAZOLE SODIUM 40 MG PO TBEC: 40.0000 mg | DELAYED_RELEASE_TABLET | Freq: Every day | ORAL | 0 refills | Status: DC

## 2023-09-10 MED ORDER — IOHEXOL 350 MG/ML SOLN
100.0000 mL | Freq: Once | INTRAVENOUS | Status: AC | PRN
  Administered 2023-09-10: 90 mL via INTRAVENOUS

## 2023-09-10 MED ORDER — SUCRALFATE 1 G PO TABS: 1.0000 g | ORAL_TABLET | Freq: Three times a day (TID) | ORAL | 0 refills | Status: DC

## 2023-09-10 MED ORDER — PANTOPRAZOLE SODIUM 40 MG PO TBEC: 40.0000 mg | DELAYED_RELEASE_TABLET | Freq: Every day | ORAL | 0 refills | Status: AC

## 2023-09-10 NOTE — Discharge Instructions (Signed)
 Your testing is reassuring.  Continue your stomach medication as prescribed.  Avoid alcohol, caffeine, NSAID medications, spicy foods.  Follow-up with with the gastroenterologist and the general surgeon for further evaluation of your gallstones.  Return to the ED with new or worsening symptoms.

## 2023-09-11 ENCOUNTER — Encounter: Payer: Self-pay | Admitting: Physician Assistant

## 2023-11-03 ENCOUNTER — Encounter: Payer: Self-pay | Admitting: Physician Assistant

## 2023-11-03 ENCOUNTER — Ambulatory Visit: Admitting: Physician Assistant

## 2023-11-03 VITALS — BP 130/82 | HR 73 | Ht 68.0 in | Wt 227.0 lb

## 2023-11-03 DIAGNOSIS — K8020 Calculus of gallbladder without cholecystitis without obstruction: Secondary | ICD-10-CM | POA: Diagnosis not present

## 2023-11-03 DIAGNOSIS — K219 Gastro-esophageal reflux disease without esophagitis: Secondary | ICD-10-CM

## 2023-11-03 DIAGNOSIS — R140 Abdominal distension (gaseous): Secondary | ICD-10-CM

## 2023-11-03 DIAGNOSIS — K760 Fatty (change of) liver, not elsewhere classified: Secondary | ICD-10-CM

## 2023-11-03 DIAGNOSIS — R143 Flatulence: Secondary | ICD-10-CM

## 2023-11-03 DIAGNOSIS — Z8711 Personal history of peptic ulcer disease: Secondary | ICD-10-CM

## 2023-11-03 DIAGNOSIS — R1013 Epigastric pain: Secondary | ICD-10-CM | POA: Diagnosis not present

## 2023-11-03 DIAGNOSIS — Z1211 Encounter for screening for malignant neoplasm of colon: Secondary | ICD-10-CM

## 2023-11-03 NOTE — Progress Notes (Signed)
 Brigitte Canard, PA-C [address] [place], [place]  [address] Phone: [phone]   Gastroenterology Consultation  Referring Provider:     No ref. provider found Primary Care Physician:  Suzzette Eth, MD (Inactive) Primary Gastroenterologist:  Brigitte Canard, PA-C / Dr. Laurell Pond  Reason for Consultation:     Epigastric pain, GERD, history of PUD.        HPI:   Kenry Cambridge is a 53 y.o. y/o male referred for consultation & management  by Suzzette Eth, MD (Inactive).    Patient went to [facility] 09/09/2023 to evaluate epigastric abdominal pain.  Remote history of ulcers.  Has been taking pantoprazole  40 Mg daily.  He reports epigastric pain that spreads up into his mid chest.  Worse after drinking coffee.  Has nausea but no vomiting.  Burning up into his throat.  Patient was given GI cocktail and his upper GI symptoms resolved.  Troponin negative x 2.  EKG unchanged.  Normal LFTs and lipase.  Normal CBC with Hgb 15.1.  No anemia.  Patient reports having an EGD 20 years ago and results are not available. He is age 57 and has never had a colonoscopy.  He is due for his first screening colonoscopy.   09/10/2023 RUQ ultrasound: Cholelithiasis without cholecystitis.  Normal CBD 5 mm.  Severe hepatic steatosis.  Questionable subtle nodularity of the liver surface suggesting possibility of cirrhosis.  09/10/2023 CT abdomen pelvis with contrast: Severe hepatic steatosis.  Cholelithiasis.  No acute abnormality.  Current symptoms: Patient states he has a history of acid reflux for many years.  Also history of fatty liver since he was a teenager.  He has had burning acid coming up into his chest 1 after eating.  He regurgitates food and acid after eating.  Recently started taking pantoprazole  40 mg once daily and Pepcid twice daily which helps.  Rarely takes NSAIDs.  Denies alcohol or tobacco use.  He has had episodes of burning in the epigastrium.  He has not had any right  upper quadrant pain.  He has a lot of generalized abdominal bloating with increased intestinal gas. Denies rectal bleeding.  Past Medical History:  Diagnosis Date   GERD (gastroesophageal reflux disease)    Hyperlipidemia    Polyp of gallbladder    Vitamin D  deficiency     History reviewed. No pertinent surgical history.  Prior to Admission medications   Medication Sig Start Date End Date Taking? Authorizing Provider  cephALEXin  (KEFLEX ) 500 MG capsule Take 1 capsule (500 mg total) by mouth 4 (four) times daily. 06/17/19   Alissa April, MD  Cholecalciferol (VITAMIN D ) 2000 UNITS CAPS Take 2 capsules (4,000 Units total) by mouth daily. 07/15/13   Suzzette Eth, MD  meclizine (ANTIVERT) 12.5 MG tablet Take 12.5 mg by mouth 2 (two) times daily.    [provider]  methocarbamol  (ROBAXIN ) 500 MG tablet Take 1 tablet (500 mg total) by mouth 2 (two) times daily. 10/05/17   Layden, Lindsey A, PA-C  pantoprazole  (PROTONIX ) 40 MG tablet Take 1 tablet (40 mg total) by mouth daily. 09/10/23   Rancour, Mara Seminole, MD  phenazopyridine  (PYRIDIUM ) 200 MG tablet Take 1 tablet (200 mg total) by mouth 3 (three) times daily. Patient not taking: Reported on 10/03/2022 06/17/19   Alissa April, MD  sucralfate  (CARAFATE ) 1 g tablet Take 1 tablet (1 g total) by mouth 4 (four) times daily -  with meals and at bedtime. 09/10/23  Rancour, Mara Seminole, MD    Family History  Problem Relation Age of Onset   Peripheral vascular disease Mother    Hypertension Mother    Vision loss Father    Cancer Sister    Migraines Sister        states died from [employer]     Social History   Tobacco Use   Smoking status: Never   Smokeless tobacco: Never  Vaping Use   Vaping status: Never Used  Substance Use Topics   Alcohol use: No   Drug use: No    Allergies as of 11/03/2023 - Review Complete 11/03/2023  Allergen Reaction Noted   Pork-derived products  01/06/2017    Review of Systems:    All systems reviewed  and negative except where noted in HPI.   Physical Exam:  BP 130/82   Pulse 73   Ht 5\' 8"  (1.727 m)   Wt 227 lb (103 kg)   BMI 34.52 kg/m  No LMP for male patient.  General:   Alert,  Well-developed, well-nourished, pleasant and cooperative in NAD Lungs:  Respirations even and unlabored.  Clear throughout to auscultation.   No wheezes, crackles, or rhonchi. No acute distress. Heart:  Regular rate and rhythm; no murmurs, clicks, rubs, or gallops. Abdomen:  Normal bowel sounds.  No bruits.  Soft, and non-distended without masses, hepatosplenomegaly or hernias noted.  No Tenderness.  No guarding or rebound tenderness.    Neurologic:  Alert and oriented x3;  grossly normal neurologically. Psych:  Alert and cooperative. Normal mood and affect.  Imaging Studies: No results found.  Labs: CBC    Component Value Date/Time   WBC 5.5 09/09/2023 1810   RBC 5.61 09/09/2023 1810   HGB 15.1 09/09/2023 1810   HCT 45.8 09/09/2023 1810   PLT 300 09/09/2023 1810   MCV 81.6 09/09/2023 1810   MCH 26.9 09/09/2023 1810   MCHC 33.0 09/09/2023 1810   RDW 15.5 09/09/2023 1810   LYMPHSABS 1.6 06/16/2019 2305   MONOABS 0.9 06/16/2019 2305   EOSABS 0.0 06/16/2019 2305   BASOSABS 0.0 06/16/2019 2305    CMP     Component Value Date/Time   NA 137 09/09/2023 1810   NA 141 07/19/2013 0929   K 3.8 09/09/2023 1810   CL 102 09/09/2023 1810   CO2 29 09/09/2023 1810   GLUCOSE 129 (H) 09/09/2023 1810   BUN 7 09/09/2023 1810   BUN 12 07/19/2013 0929   CREATININE 1.27 (H) 09/09/2023 1810   CALCIUM 9.1 09/09/2023 1810   PROT 7.5 09/09/2023 1810   PROT 7.3 07/05/2013 1011   ALBUMIN 4.6 09/09/2023 1810   ALBUMIN 4.7 07/05/2013 1011   AST 21 09/09/2023 1810   ALT 36 09/09/2023 1810   ALKPHOS 85 09/09/2023 1810   BILITOT 0.6 09/09/2023 1810   GFRNONAA >60 09/09/2023 1810   GFRAA >60 06/16/2019 2305    Assessment and Plan:   Princeton Lame is a 53 y.o. y/o male has been referred for ED follow-up  of upper abdominal pain.  RUQ ultrasound and abdominal pelvic CT showed severe hepatic steatosis and cholelithiasis without cholecystitis.  Labs showed normal CBC, CMP, and lipase.  Normal LFTs.  He was given GI cocktail with benefit.  Told to follow-up with GI.  Symptoms are most consistent with acid reflux and gastritis.  Recurrent peptic ulcers in the differential.  Gallstones appear to be asymptomatic.  No RUQ pain.  1.  Epigastric pain -Diatherix H. Pylori -Scheduling EGD I discussed risks  of EGD with patient to include risk of bleeding, perforation, and risk of sedation.  Patient expressed understanding and agrees to proceed with EGD.   2.  GERD -Recommend Lifestyle Modifications to prevent Acid Reflux.  Rec. Avoid coffee, sodas, peppermint, garlic, onions, alcohol, citrus fruits, chocolate, tomatoes, fatty and spicey foods.  Avoid eating 2-3 hours before bedtime.    3.  Remote history of peptic ulcer - Avoid NSAIDs - Continue PPI - Check for H. pylori  4.  Severe hepatic steatosis; questionable early cirrhosis on imaging. -Recommend a low-fat diet, regular exercise, and weight loss. Patient education handout about fatty liver disease was given and discussed from up-to-date.  I discussed his increased risk of developing cirrhosis. -RUQ US  with Elastography -I discussed Rezdiffra medication to treat fatty liver disease with patient.  It is Only approved in patients who have F2 or F3 liver fibrosis.  - Fibrosis 4 Score = .58 (Low risk)        Interpretation for patients with NAFLD          <1.30       -  F0-F1 (Low risk)          1.30-2.67 -  Indeterminate           >2.67      -  F3-F4 (High risk)     Validated for ages 63-65  5.  Cholelithiasis without cholecystitis; currently asymptomatic. - We discussed surgical referral if he becomes symptomatic. - Low-fat diet  6.  Colon cancer screening -Scheduling Colonoscopy I discussed risks of colonoscopy with patient to include risk  of bleeding, colon perforation, and risk of sedation.  Patient expressed understanding and agrees to proceed with colonoscopy.   Follow up 4 weeks after EGD / Colon, and Elastography with TG.  Brigitte Canard, PA-C

## 2023-11-03 NOTE — Progress Notes (Signed)
 Addendum: Reviewed and agree with assessment and management plan. Asha Grumbine, Carie Caddy, MD

## 2023-11-03 NOTE — Patient Instructions (Signed)
 You have been scheduled for an abdominal ultrasound at [facility] (1st floor of hospital) on 11/18/23 at 8:00 am. Please arrive 30 minutes prior to your appointment for registration. Make certain not to have anything to eat or drink after midnight prior to your appointment. Should you need to reschedule your appointment, please contact radiology at [phone]. This test typically takes about 30 minutes to perform. ___________________________________________________________________________  Your provider has ordered "Diatherix" stool testing for you. You have received a kit from our office today containing all necessary supplies to complete this test. Please carefully read the stool collection instructions provided in the kit before opening the accompanying materials. In addition, be sure there is a label providing your full name and date of birth on the "puritan opti-swab" tube that is supplied in the kit (if you do not see a label with this information on your test tube, please make us  aware before test collection!). After completing the test, you should secure the purtian tube into the specimen biohazard bag. The [facility] E-Req sheet (including date and time of specimen collection) should be placed into the outside pocket of the specimen biohazard bag and returned to the [place] lab (basement floor of [facility]) within 3 days of collection. Please make sure to give the specimen to a staff member at the lab. DO NOT leave the specimen on the counter.   If the specimen date and time (can be found in the upper right boxed portion of the sheet) are not filled out on the E-Req sheet, the test will NOT be performed.   Please follow up sooner if symptoms increase or worsen  Due to recent changes in healthcare laws, you may see the results of your imaging and laboratory studies on MyChart before your provider has had a chance to review them.  We understand that in  some cases there may be results that are confusing or concerning to you. Not all laboratory results come back in the same time frame and the provider may be waiting for multiple results in order to interpret others.  Please give us  48 hours in order for your provider to thoroughly review all the results before contacting the office for clarification of your results.   _______________________________________________________  If your blood pressure at your visit was 140/90 or greater, please contact your primary care physician to follow up on this.  _______________________________________________________  If you are age 67 or older, your body mass index should be between 23-30. Your Body mass index is 34.52 kg/m. If this is out of the aforementioned range listed, please consider follow up with your Primary Care Provider.  If you are age 94 or younger, your body mass index should be between 19-25. Your Body mass index is 34.52 kg/m. If this is out of the aformentioned range listed, please consider follow up with your Primary Care Provider.   ________________________________________________________  The  GI providers would like to encourage you to use MYCHART to communicate with providers for non-urgent requests or questions.  Due to long hold times on the telephone, sending your provider a message by [facility] may be a faster and more efficient way to get a response.  Please allow 48 business hours for a response.  Please remember that this is for non-urgent requests.  _______________________________________________________ Thank you for trusting me with your gastrointestinal care!   Brigitte Canard, PA

## 2023-11-13 ENCOUNTER — Telehealth: Payer: Self-pay

## 2023-11-13 NOTE — Telephone Encounter (Signed)
 I called the pt to inform him of negative H.Pylori stool test per Brigitte Canard. Pt's wife is aware, understands and will relay the message.

## 2023-11-18 ENCOUNTER — Ambulatory Visit (HOSPITAL_COMMUNITY): Admission: RE | Admit: 2023-11-18 | Source: Ambulatory Visit

## 2023-12-06 ENCOUNTER — Emergency Department (HOSPITAL_BASED_OUTPATIENT_CLINIC_OR_DEPARTMENT_OTHER)
Admission: EM | Admit: 2023-12-06 | Discharge: 2023-12-06 | Disposition: A | Discharge status: Home / Self Care | Attending: Emergency Medicine | Admitting: Emergency Medicine

## 2023-12-06 ENCOUNTER — Other Ambulatory Visit: Payer: Self-pay

## 2023-12-06 ENCOUNTER — Encounter (HOSPITAL_BASED_OUTPATIENT_CLINIC_OR_DEPARTMENT_OTHER): Payer: Self-pay | Admitting: Emergency Medicine

## 2023-12-06 DIAGNOSIS — R197 Diarrhea, unspecified: Secondary | ICD-10-CM | POA: Diagnosis present

## 2023-12-06 DIAGNOSIS — K625 Hemorrhage of anus and rectum: Secondary | ICD-10-CM | POA: Insufficient documentation

## 2023-12-06 LAB — CBC WITH DIFFERENTIAL/PLATELET
Abs Immature Granulocytes: 0.01 10*3/uL (ref 0.00–0.07)
Basophils Absolute: 0 10*3/uL (ref 0.0–0.1)
Basophils Relative: 0 %
Eosinophils Absolute: 0.3 10*3/uL (ref 0.0–0.5)
Eosinophils Relative: 5 %
HCT: 45.8 % (ref 39.0–52.0)
Hemoglobin: 15.3 g/dL (ref 13.0–17.0)
Immature Granulocytes: 0 %
Lymphocytes Relative: 41 %
Lymphs Abs: 2.5 10*3/uL (ref 0.7–4.0)
MCH: 27.3 pg (ref 26.0–34.0)
MCHC: 33.4 g/dL (ref 30.0–36.0)
MCV: 81.6 fL (ref 80.0–100.0)
Monocytes Absolute: 0.6 10*3/uL (ref 0.1–1.0)
Monocytes Relative: 11 %
Neutro Abs: 2.6 10*3/uL (ref 1.7–7.7)
Neutrophils Relative %: 43 %
Platelets: 326 10*3/uL (ref 150–400)
RBC: 5.61 MIL/uL (ref 4.22–5.81)
RDW: 16.1 % — ABNORMAL HIGH (ref 11.5–15.5)
WBC: 6 10*3/uL (ref 4.0–10.5)
nRBC: 0 % (ref 0.0–0.2)

## 2023-12-06 LAB — COMPREHENSIVE METABOLIC PANEL WITH GFR
ALT: 27 U/L (ref 0–44)
AST: 22 U/L (ref 15–41)
Albumin: 4.5 g/dL (ref 3.5–5.0)
Alkaline Phosphatase: 99 U/L (ref 38–126)
Anion gap: 13 (ref 5–15)
BUN: 14 mg/dL (ref 6–20)
CO2: 24 mmol/L (ref 22–32)
Calcium: 9.2 mg/dL (ref 8.9–10.3)
Chloride: 103 mmol/L (ref 98–111)
Creatinine, Ser: 1.33 mg/dL — ABNORMAL HIGH (ref 0.61–1.24)
GFR, Estimated: >60 mL/min (ref >60)
Glucose, Bld: 99 mg/dL (ref 70–99)
Potassium: 3.7 mmol/L (ref 3.5–5.1)
Sodium: 140 mmol/L (ref 135–145)
Total Bilirubin: 0.3 mg/dL (ref 0.0–1.2)
Total Protein: 7.4 g/dL (ref 6.5–8.1)

## 2023-12-06 LAB — URINALYSIS, ROUTINE W REFLEX MICROSCOPIC
Bilirubin Urine: NEGATIVE
Glucose, UA: NEGATIVE mg/dL
Hgb urine dipstick: NEGATIVE
Ketones, ur: NEGATIVE mg/dL
Leukocytes,Ua: NEGATIVE
Nitrite: NEGATIVE
Protein, ur: NEGATIVE mg/dL
Specific Gravity, Urine: 1.025 (ref 1.005–1.030)
pH: 6 (ref 5.0–8.0)

## 2023-12-06 LAB — LIPASE, BLOOD: Lipase: 19 U/L (ref 11–51)

## 2023-12-06 MED ORDER — AZITHROMYCIN 250 MG PO TABS: 500.0000 mg | ORAL_TABLET | Freq: Every day | ORAL | 0 refills | Status: DC

## 2023-12-06 NOTE — ED Provider Notes (Signed)
 [facility] Provider Note   CSN: [id number] Arrival date & time: 12/06/23  1431     Patient presents with: Rectal Bleeding   Lance Warner is a 53 y.o. male.   53 yo M with a chief complaints of diarrhea.  Going on for about a week.  Has had some blood mixed in with the stool at times.  He thought maybe he had hemorrhoids.  He has had some lower abdominal cramping from time to time.  He had dysentery in the remote past.  He thinks this feels somewhat similar.  He denies recent international travel.  Denies suspicious food intake.   Rectal Bleeding      Prior to Admission medications   Medication Sig Start Date End Date Taking? Authorizing Provider  azithromycin (ZITHROMAX) 250 MG tablet Take 2 tablets (500 mg total) by mouth daily. 12/06/23  Yes Albertus Hughs, DO  Cholecalciferol (VITAMIN D ) 2000 UNITS CAPS Take 2 capsules (4,000 Units total) by mouth daily. 07/15/13   Suzzette Eth, MD  meclizine (ANTIVERT) 12.5 MG tablet Take 12.5 mg by mouth 2 (two) times daily.    [provider]  pantoprazole  (PROTONIX ) 40 MG tablet Take 1 tablet (40 mg total) by mouth daily. 09/10/23   Earma Gloss, MD    Allergies: Pork-derived products    Review of Systems  Gastrointestinal:  Positive for hematochezia.    Updated Vital Signs BP (!) 144/100   Pulse 91   Temp 98.3 F (36.8 C) (Oral)   Resp 18   Ht 5' 8 (1.727 m)   Wt 99.8 kg   SpO2 100%   BMI 33.45 kg/m   Physical Exam Vitals and nursing note reviewed.  Constitutional:      Appearance: He is well-developed.  HENT:     Head: Normocephalic and atraumatic.   Eyes:     Pupils: Pupils are equal, round, and reactive to light.   Neck:     Vascular: No JVD.   Cardiovascular:     Rate and Rhythm: Normal rate and regular rhythm.     Heart sounds: No murmur heard.    No friction rub. No gallop.  Pulmonary:     Effort: No respiratory distress.     Breath sounds: No  wheezing.  Abdominal:     General: There is no distension.     Tenderness: There is no abdominal tenderness. There is no guarding or rebound.  Genitourinary:    Comments: No appreciable hemorrhoids or fissure.  Musculoskeletal:        General: Normal range of motion.     Cervical back: Normal range of motion and neck supple.   Skin:    Coloration: Skin is not pale.     Findings: No rash.   Neurological:     Mental Status: He is alert and oriented to person, place, and time.   Psychiatric:        Behavior: Behavior normal.     (all labs ordered are listed, but only abnormal results are displayed) Labs Reviewed  COMPREHENSIVE METABOLIC PANEL WITH GFR - Abnormal; Notable for the following components:      Result Value   Creatinine, Ser 1.33 (*)    All other components within normal limits  CBC WITH DIFFERENTIAL/PLATELET - Abnormal; Notable for the following components:   RDW 16.1 (*)    All other components within normal limits  LIPASE, BLOOD  URINALYSIS, ROUTINE W REFLEX MICROSCOPIC    EKG:  None  Radiology: No results found.   Procedures   Medications Ordered in the ED - No data to display                                  Medical Decision Making Amount and/or Complexity of Data Reviewed Labs: ordered.  Risk Prescription drug management.   53 yo M with a cc of diarrhea.  Going on for the past week.  He has had some blood intermixed with it off and on.  Has abdominal cramping.  He has a benign abdominal exam here.  He is well appearing and nontoxic.  No anemia here.  No leukocytosis.  UA negative for infection.  I am going to start him on a 3-day course of antibiotics for possible infectious diarrhea.  Will have him follow-up with his family doctor in the office.  4:31 PM:  I have discussed the diagnosis/risks/treatment options with the patient.  Evaluation and diagnostic testing in the emergency department does not suggest an emergent condition requiring  admission or immediate intervention beyond what has been performed at this time.  They will follow up with PCP. We also discussed returning to the ED immediately if new or worsening sx occur. We discussed the sx which are most concerning (e.g., sudden worsening pain, fever, inability to tolerate by mouth) that necessitate immediate return. Medications administered to the patient during their visit and any new prescriptions provided to the patient are listed below.  Medications given during this visit Medications - No data to display   The patient appears reasonably screen and/or stabilized for discharge and I doubt any other medical condition or other [facility] requiring further screening, evaluation, or treatment in the ED at this time prior to discharge.       Final diagnoses:  Rectal bleeding  Diarrhea, unspecified type    ED Discharge Orders          Ordered    azithromycin (ZITHROMAX) 250 MG tablet  Daily        12/06/23 1628               Albertus Hughs, DO 12/06/23 1631

## 2023-12-06 NOTE — ED Triage Notes (Signed)
 Pt c/o diarrhea x 1 wk; notices blood with some episodes; has some mild pain in lower abd

## 2023-12-06 NOTE — Discharge Instructions (Signed)
 Follow up with your family doc in the office.  Return for worsening symptoms, fever, inability to eat or drink.

## 2023-12-11 ENCOUNTER — Ambulatory Visit (HOSPITAL_COMMUNITY)
Admission: RE | Admit: 2023-12-11 | Discharge: 2023-12-11 | Disposition: A | Discharge status: Home / Self Care | Source: Ambulatory Visit | Attending: Physician Assistant | Admitting: Physician Assistant

## 2023-12-11 DIAGNOSIS — K760 Fatty (change of) liver, not elsewhere classified: Secondary | ICD-10-CM | POA: Insufficient documentation

## 2023-12-16 NOTE — Progress Notes (Unsigned)
 Ellouise Console, PA-C [address] [place], [place]  [address] Phone: [phone]   Primary Care Physician: Cyrena Gwenn SQUIBB, MD (Inactive)  Primary Gastroenterologist:  Ellouise Console, PA-C / Dr. Stacia  Chief Complaint:  F/U Epigastric Pain, GERD, Diarrhea, Rectal Bleeding       HPI:   Lance Warner is a 53 y.o. male returns for 5-week follow-up of epigastric pain, GERD, remote history of peptic ulcer, fatty liver, cholelithiasis, and colon cancer screening.  I last saw him 11/03/2022 and recommended he schedule an EGD and colonoscopy.  Patient has not yet scheduled procedures.  He is nervous about procedures, however he is willing to go ahead and schedule EGD and colonoscopy per encouragement from his wife.  Diatherix H. pylori stool test was negative.  He was continued on pantoprazole  40 Mg daily and sucralfate .  He went to [facility] 12/06/23 to evaluate 1 week history of diarrhea and rectal bleeding.  He saw mild bright red blood mixed in his stool.  No recent travel.  Normal CBC (WBC 6.0, Hgb 15.3).  Normal Lipase.  Normal CMP except Cr 1.33.  Normal AST 22, ALT 27.  He continues to have irregular bowel habits with diarrhea alternating with constipation.  He did not see any blood in his stool today.  He has some bilateral lower abdominal cramping and epigastric burning.  He feels like he needs something to help his bowel movements and abdominal cramping.  He had a negative GI pathogen panel PCR stool test done in the past week through his PCP.  12/11/23 RUQ ultrasound with elastography: Median K PA 7.6, consistent with minimal fibrosis.  Moderate hepatic steatosis.  No liver lesions.  Last EGD 01/2011 by Dr. Lennard was normal.  He has never had a colonoscopy.  09/10/2023 RUQ ultrasound: Cholelithiasis without cholecystitis.  Normal CBD 5 mm.  Severe hepatic steatosis.  Questionable subtle nodularity of the liver surface suggesting possibility of cirrhosis.    09/10/2023 CT abdomen pelvis with contrast: Severe hepatic steatosis.  Cholelithiasis.  No acute abnormality.  Current Outpatient Medications  Medication Sig Dispense Refill   Cholecalciferol (VITAMIN D ) 2000 UNITS CAPS Take 2 capsules (4,000 Units total) by mouth daily. 60 capsule 11   dicyclomine (BENTYL) 20 MG tablet Take 1 tablet (20 mg total) by mouth 2 (two) times daily. 60 tablet 2   meclizine (ANTIVERT) 12.5 MG tablet Take 12.5 mg by mouth 2 (two) times daily.     Na Sulfate-K Sulfate-Mg Sulfate concentrate (SUPREP) 17.5-3.13-1.6 GM/177ML SOLN Take 1 kit (354 mLs total) by mouth once for 1 dose. 354 mL 0   pantoprazole  (PROTONIX ) 40 MG tablet Take 1 tablet (40 mg total) by mouth daily. 30 tablet 0   psyllium (METAMUCIL SMOOTH TEXTURE) 58.6 % powder Mix 2 Tablespoons in a drink Once daily for Irregular Bowel Habits.     No current facility-administered medications for this visit.    Allergies as of 12/17/2023 - Review Complete 12/17/2023  Allergen Reaction Noted   Pork-derived products  01/06/2017    Past Medical History:  Diagnosis Date   GERD (gastroesophageal reflux disease)    Hyperlipidemia    Polyp of gallbladder    Vitamin D  deficiency     History reviewed. No pertinent surgical history.  Review of Systems:    All systems reviewed and negative except where noted in HPI.    Physical Exam:  BP 126/80 (BP Location: Left Arm, Patient Position: Sitting, Cuff Size:  Large)   Pulse 92   Ht 5' 8 (1.727 m)   Wt 231 lb 2 oz (104.8 kg)   BMI 35.14 kg/m  No LMP for male patient.  General: Well-nourished, well-developed in no acute distress.  Lungs: Clear to auscultation bilaterally. Non-labored. Heart: Regular rate and rhythm, no murmurs rubs or gallops.  Abdomen: Bowel sounds are normal; Abdomen is Soft; No hepatosplenomegaly, masses or hernias;  Mild Epigastric and Bilateral Lower Abdominal Tenderness; No guarding or rebound tenderness. Neuro: Alert and oriented x  3.  Grossly intact.  Psych: Alert and cooperative, normal mood and affect.  Imaging Studies: US  ABDOMEN RUQ W/ELASTOGRAPHY Result Date: 12/17/2023 CLINICAL DATA:  Fatty liver. EXAM: US  ABDOMEN LIMITED - RIGHT UPPER QUADRANT ULTRASOUND HEPATIC ELASTOGRAPHY TECHNIQUE: Sonography of the right upper quadrant was performed. In addition, ultrasound elastography evaluation of the liver was performed. A region of interest was placed within the right lobe of the liver. Following application of a compressive sonographic pulse, tissue compressibility was assessed. Multiple assessments were performed at the selected site. Median tissue compressibility was determined. Previously, hepatic stiffness was assessed by shear wave velocity. Based on recently published Society of Radiologists in Ultrasound consensus article, reporting is now recommended to be performed in the SI units of pressure (kiloPascals) representing hepatic stiffness/elasticity. The obtained result is compared to the published reference standards. (cACLD = compensated Advanced Chronic Liver Disease) COMPARISON:  September 09, 2023 FINDINGS: ULTRASOUND ABDOMEN LIMITED RIGHT UPPER QUADRANT Gallbladder: No gallstones or wall thickening visualized. No sonographic Murphy sign noted. Common bile duct: Diameter: 3.6 mm. Liver: No focal lesion. Increased echotexture. Portal vein is patent on color Doppler imaging with normal direction of blood flow towards the liver. ULTRASOUND HEPATIC ELASTOGRAPHY Device: Siemens Helix VTQ Patient position: Supine Transducer 9C2 Number of measurements: 12 Hepatic segment:  8 Median kPa: 7.6 IQR: 3.4 IQR/Median kPa ratio: 0.5 Data quality: IQR/Median kPa ratio of 0.3 or greater indicates reduced accuracy Diagnostic category: < or = 9 kPa: in the absence of other known clinical signs, rules out cACLD The use of hepatic elastography is applicable to patients with viral hepatitis and non-alcoholic fatty liver disease. At this time, there  is insufficient data for the referenced cut-off values and use in other causes of liver disease, including alcoholic liver disease. Patients, however, may be assessed by elastography and serve as their own reference standard/baseline. In patients with non-alcoholic liver disease, the values suggesting compensated advanced chronic liver disease (cACLD) may be lower, and patients may need additional testing with elasticity results of 7-9 kPa. Please note that abnormal hepatic elasticity and shear wave velocities may also be identified in clinical settings other than with hepatic fibrosis, such as: acute hepatitis, elevated right heart and central venous pressures including use of beta blockers, veno-occlusive disease (Budd-Chiari), infiltrative processes such as mastocytosis/amyloidosis/infiltrative tumor/lymphoma, extrahepatic cholestasis, with hyperemia in the post-prandial state, and with liver transplantation. Correlation with patient history, laboratory data, and clinical condition recommended. Diagnostic Categories: < or =5 kPa: high probability of being normal < or =9 kPa: in the absence of other known clinical signs, rules out cACLD >9 kPa and ?13 kPa: suggestive of cACLD, but needs further testing >13 kPa: highly suggestive of cACLD > or =17 kPa: highly suggestive of cACLD with an increased probability of clinically significant portal hypertension IMPRESSION: ULTRASOUND RUQ: Fatty infiltration of liver. ULTRASOUND HEPATIC ELASTOGRAPHY: Median kPa:  7.6 Diagnostic category: < or = 9 kPa: in the absence of other known clinical signs, rules out cACLD Electronically  Signed   By: Craig Farr M.D.   On: 12/17/2023 10:51    Labs: CBC    Component Value Date/Time   WBC 6.0 12/06/2023 1444   RBC 5.61 12/06/2023 1444   HGB 15.3 12/06/2023 1444   HCT 45.8 12/06/2023 1444   PLT 326 12/06/2023 1444   MCV 81.6 12/06/2023 1444   MCH 27.3 12/06/2023 1444   MCHC 33.4 12/06/2023 1444   RDW 16.1 (H) 12/06/2023  1444   LYMPHSABS 2.5 12/06/2023 1444   MONOABS 0.6 12/06/2023 1444   EOSABS 0.3 12/06/2023 1444   BASOSABS 0.0 12/06/2023 1444    CMP     Component Value Date/Time   NA 140 12/06/2023 1444   NA 141 07/19/2013 0929   K 3.7 12/06/2023 1444   CL 103 12/06/2023 1444   CO2 24 12/06/2023 1444   GLUCOSE 99 12/06/2023 1444   BUN 14 12/06/2023 1444   BUN 12 07/19/2013 0929   CREATININE 1.33 (H) 12/06/2023 1444   CALCIUM 9.2 12/06/2023 1444   PROT 7.4 12/06/2023 1444   PROT 7.3 07/05/2013 1011   ALBUMIN 4.5 12/06/2023 1444   ALBUMIN 4.7 07/05/2013 1011   AST 22 12/06/2023 1444   ALT 27 12/06/2023 1444   ALKPHOS 99 12/06/2023 1444   BILITOT 0.3 12/06/2023 1444   GFRNONAA >60 12/06/2023 1444   GFRAA >60 06/16/2019 2305    Assessment and Plan:   Lance Warner is a 53 y.o. y/o male returns for follow-up of multiple GI symptoms:  1.  Epigastric pain (recent Diatherix H. pylori stool test negative).  Patient reports remote history of peptic ulcer disease.  History of GERD.  Upper GI symptoms not controlled on pantoprazole  and sucralfate . - .Scheduling EGD I discussed risks of EGD with patient to include risk of bleeding, perforation, and risk of sedation.  Patient expressed understanding and agrees to proceed with EGD.   2.  Recent acute diarrhea with blood in stool; Recent GI Pathogen Panal PCR Negative. - Schedule Colonoscopy I discussed risks of colonoscopy with patient to include risk of bleeding, colon perforation, and risk of sedation.  Patient expressed understanding and agrees to proceed with colonoscopy.   3. Lower Abdominal Cramping and Irregular Bowel Habits: For Abdominal Pain: - Rx Dicycomine 20mg  1 tablet twice daily as needed, #60, 2 RF.  For Constipation / Irregular Bowel Movements: - Start OTC Metamucil Powder; Mix 2 Tablespoons in a drink every Day.  4.  Severe hepatic steatosis - Recent liver elastography Showed Metavir KPa 7.6 (Very mild fibrosis). -  Continue lifestyle modification with low-fat diet, regular exercise, weight loss  5.  Cholelithiasis without cholecystitis; currently asymptomatic. - We discussed surgical referral if he becomes symptomatic. - Continue Low-fat diet   6.  Colon cancer screening: No Previous Colonoscopy -Scheduling Colonoscopy I discussed risks of colonoscopy with patient to include risk of bleeding, colon perforation, and risk of sedation.  Patient expressed understanding and agrees to proceed with colonoscopy.    Ellouise Console, PA-C  Follow up Based on EGD / Colon Results and GI Symptoms.

## 2023-12-17 ENCOUNTER — Ambulatory Visit: Admitting: Physician Assistant

## 2023-12-17 ENCOUNTER — Ambulatory Visit: Payer: Self-pay | Admitting: Physician Assistant

## 2023-12-17 ENCOUNTER — Encounter: Payer: Self-pay | Admitting: Physician Assistant

## 2023-12-17 VITALS — BP 126/80 | HR 92 | Ht 68.0 in | Wt 231.1 lb

## 2023-12-17 DIAGNOSIS — K760 Fatty (change of) liver, not elsewhere classified: Secondary | ICD-10-CM

## 2023-12-17 DIAGNOSIS — R197 Diarrhea, unspecified: Secondary | ICD-10-CM

## 2023-12-17 DIAGNOSIS — K5900 Constipation, unspecified: Secondary | ICD-10-CM

## 2023-12-17 DIAGNOSIS — R1013 Epigastric pain: Secondary | ICD-10-CM | POA: Diagnosis not present

## 2023-12-17 DIAGNOSIS — R1030 Lower abdominal pain, unspecified: Secondary | ICD-10-CM

## 2023-12-17 DIAGNOSIS — R198 Other specified symptoms and signs involving the digestive system and abdomen: Secondary | ICD-10-CM

## 2023-12-17 DIAGNOSIS — K219 Gastro-esophageal reflux disease without esophagitis: Secondary | ICD-10-CM

## 2023-12-17 DIAGNOSIS — K625 Hemorrhage of anus and rectum: Secondary | ICD-10-CM | POA: Diagnosis not present

## 2023-12-17 DIAGNOSIS — K8020 Calculus of gallbladder without cholecystitis without obstruction: Secondary | ICD-10-CM

## 2023-12-17 DIAGNOSIS — R109 Unspecified abdominal pain: Secondary | ICD-10-CM

## 2023-12-17 MED ORDER — METAMUCIL SMOOTH TEXTURE 58.6 % PO POWD: ORAL | Status: DC

## 2023-12-17 MED ORDER — DICYCLOMINE HCL 20 MG PO TABS
20.0000 mg | ORAL_TABLET | Freq: Two times a day (BID) | ORAL | 2 refills | Status: DC
Start: 2023-12-17 — End: 2024-03-30

## 2023-12-17 MED ORDER — NA SULFATE-K SULFATE-MG SULF 17.5-3.13-1.6 GM/177ML PO SOLN: 1.0000 | Freq: Once | ORAL | 0 refills | Status: AC

## 2023-12-17 NOTE — Patient Instructions (Addendum)
 For Abdominal Pain: Rx Dicycomine 20mg  1 tablet twice daily as needed.  For Constipation / Irregular Bowel Movements: Start OTC Metamucil Powder; Mix 2 Tablespoons in a drink every Day.  You have been scheduled for an Endoscopy and Colonoscopy. Please follow the written instructions given to you at your visit today.  If you use inhalers (even only as needed), please bring them with you on the day of your procedure.  DO NOT TAKE 7 DAYS PRIOR TO TEST- Trulicity (dulaglutide) Ozempic, Wegovy (semaglutide) Mounjaro (tirzepatide) Bydureon Bcise (exanatide extended release)  DO NOT TAKE 1 DAY PRIOR TO YOUR TEST Rybelsus (semaglutide) Adlyxin (lixisenatide) Victoza (liraglutide) Byetta (exanatide) ___________________________________________________________________________  Please follow up sooner if symptoms increase or worsen __________________________________________________________________________  Due to recent changes in healthcare laws, you may see the results of your imaging and laboratory studies on MyChart before your provider has had a chance to review them.  We understand that in some cases there may be results that are confusing or concerning to you. Not all laboratory results come back in the same time frame and the provider may be waiting for multiple results in order to interpret others.  Please give us  48 hours in order for your provider to thoroughly review all the results before contacting the office for clarification of your results.   Thank you for trusting me with your gastrointestinal care!   Ellouise Console, PA-C _______________________________________________________  If your blood pressure at your visit was 140/90 or greater, please contact your primary care physician to follow up on this.  _______________________________________________________  If you are age 51 or older, your body mass index should be between 23-30. Your Body mass index is 35.14 kg/m. If this  is out of the aforementioned range listed, please consider follow up with your Primary Care Provider.  If you are age 63 or younger, your body mass index should be between 19-25. Your Body mass index is 35.14 kg/m. If this is out of the aformentioned range listed, please consider follow up with your Primary Care Provider.   ________________________________________________________  The [facility] providers would like to encourage you to use MYCHART to communicate with providers for non-urgent requests or questions.  Due to long hold times on the telephone, sending your provider a message by [facility] may be a faster and more efficient way to get a response.  Please allow 48 business hours for a response.  Please remember that this is for non-urgent requests.  _______________________________________________________

## 2023-12-18 NOTE — Progress Notes (Signed)
 Agree with the assessment and plan as outlined by Ellouise Console, PA-C.  If EGD unrevealing for source of abdominal pain, would refer to surgery for consideration of cholecystectomy.  Would also consider Rezdiffra for MASH

## 2023-12-25 ENCOUNTER — Encounter: Payer: Self-pay | Admitting: Family Medicine

## 2024-01-20 ENCOUNTER — Encounter: Payer: Self-pay | Admitting: Gastroenterology

## 2024-01-27 ENCOUNTER — Ambulatory Visit (AMBULATORY_SURGERY_CENTER): Admitting: Gastroenterology

## 2024-01-27 ENCOUNTER — Encounter: Payer: Self-pay | Admitting: Gastroenterology

## 2024-01-27 VITALS — BP 115/73 | HR 83 | Temp 97.2°F | Resp 10 | Ht 68.0 in | Wt 231.0 lb

## 2024-01-27 DIAGNOSIS — Z1211 Encounter for screening for malignant neoplasm of colon: Secondary | ICD-10-CM

## 2024-01-27 DIAGNOSIS — D120 Benign neoplasm of cecum: Secondary | ICD-10-CM | POA: Diagnosis not present

## 2024-01-27 DIAGNOSIS — R197 Diarrhea, unspecified: Secondary | ICD-10-CM

## 2024-01-27 DIAGNOSIS — R1013 Epigastric pain: Secondary | ICD-10-CM

## 2024-01-27 DIAGNOSIS — K635 Polyp of colon: Secondary | ICD-10-CM

## 2024-01-27 DIAGNOSIS — D122 Benign neoplasm of ascending colon: Secondary | ICD-10-CM

## 2024-01-27 DIAGNOSIS — D123 Benign neoplasm of transverse colon: Secondary | ICD-10-CM | POA: Diagnosis not present

## 2024-01-27 DIAGNOSIS — K621 Rectal polyp: Secondary | ICD-10-CM | POA: Diagnosis not present

## 2024-01-27 DIAGNOSIS — D128 Benign neoplasm of rectum: Secondary | ICD-10-CM

## 2024-01-27 DIAGNOSIS — K219 Gastro-esophageal reflux disease without esophagitis: Secondary | ICD-10-CM

## 2024-01-27 DIAGNOSIS — D124 Benign neoplasm of descending colon: Secondary | ICD-10-CM

## 2024-01-27 DIAGNOSIS — K625 Hemorrhage of anus and rectum: Secondary | ICD-10-CM

## 2024-01-27 DIAGNOSIS — K6289 Other specified diseases of anus and rectum: Secondary | ICD-10-CM | POA: Diagnosis not present

## 2024-01-27 MED ORDER — SODIUM CHLORIDE 0.9 % IV SOLN: 500.0000 mL | INTRAVENOUS | Status: DC

## 2024-01-27 NOTE — Progress Notes (Signed)
 [facility] History and Physical   Primary Care Physician:  Cyrena Gwenn SQUIBB, MD (Inactive)   Reason for Procedure:   Epigastric pain, colon cancer screening  Plan:    EGD, colonoscopy     HPI: Lance Warner is a 53 y.o. male undergoing initial average risk screening colonoscopy.  He has no family history of colon cancer .  He is also undergoing EGD to evaluate chronic epigastric pain and GERD symptoms.   No dysphagia.  H. Pylori stool test negative.  Symptoms partially controlled with pantoprazole .     Past Medical History:  Diagnosis Date   Allergy    GERD (gastroesophageal reflux disease)    Hyperlipidemia    Polyp of gallbladder    Vitamin D  deficiency     History reviewed. No pertinent surgical history.  Prior to Admission medications   Medication Sig Start Date End Date Taking? Authorizing Provider  Cholecalciferol (VITAMIN D ) 2000 UNITS CAPS Take 2 capsules (4,000 Units total) by mouth daily. 07/15/13  Yes Cyrena Gwenn SQUIBB, MD  pantoprazole  (PROTONIX ) 40 MG tablet Take 1 tablet (40 mg total) by mouth daily. 09/10/23  Yes Rancour, Garnette, MD  dicyclomine  (BENTYL ) 20 MG tablet Take 1 tablet (20 mg total) by mouth 2 (two) times daily. 12/17/23 03/16/24  [place], PA-C  meclizine (ANTIVERT) 12.5 MG tablet Take 12.5 mg by mouth 2 (two) times daily.    [provider]  psyllium (METAMUCIL SMOOTH TEXTURE) 58.6 % powder Mix 2 Tablespoons in a drink Once daily for Irregular Bowel Habits. 12/17/23   [place], PA-C    Current Outpatient Medications  Medication Sig Dispense Refill   Cholecalciferol (VITAMIN D ) 2000 UNITS CAPS Take 2 capsules (4,000 Units total) by mouth daily. 60 capsule 11   pantoprazole  (PROTONIX ) 40 MG tablet Take 1 tablet (40 mg total) by mouth daily. 30 tablet 0   dicyclomine  (BENTYL ) 20 MG tablet Take 1 tablet (20 mg total) by mouth 2 (two) times daily. 60 tablet 2   meclizine (ANTIVERT) 12.5 MG tablet Take 12.5 mg by mouth 2  (two) times daily.     psyllium (METAMUCIL SMOOTH TEXTURE) 58.6 % powder Mix 2 Tablespoons in a drink Once daily for Irregular Bowel Habits.     Current Facility-Administered Medications  Medication Dose Route Frequency Provider Last Rate Last Admin   0.9 %  sodium chloride  infusion  500 mL Intravenous Continuous Stacia Glendia BRAVO, MD        Allergies as of 01/27/2024 - Review Complete 01/27/2024  Allergen Reaction Noted   Pork-derived products Hives 01/06/2017    Family History  Problem Relation Age of Onset   Peripheral vascular disease Mother    Hypertension Mother    Vision loss Father    Cancer Sister    Migraines Sister        states died from aneyrysm   Colon cancer Neg Hx    Rectal cancer Neg Hx    Stomach cancer Neg Hx    Esophageal cancer Neg Hx     Social History   Socioeconomic History   Marital status: Married    Spouse name: Not on file   Number of children: 3   Years of education: Not on file   Highest education level: Not on file  Occupational History   Not on file  Tobacco Use   Smoking status: Never   Smokeless tobacco: Never  Vaping Use   Vaping status: Unknown  Substance and Sexual Activity   Alcohol use:  No   Drug use: No   Sexual activity: Not on file  Other Topics Concern   Not on file  Social History Narrative   Not on file   Social Drivers of Health   Financial Resource Strain: Not on file  Food Insecurity: Not on file  Transportation Needs: Not on file  Physical Activity: Not on file  Stress: Not on file  Social Connections: Not on file  Intimate Partner Violence: Not on file    Review of Systems:  All other review of systems negative except as mentioned in the HPI.  Physical Exam: Vital signs BP (!) 155/94   Pulse 71   Temp (!) 97.2 F (36.2 C)   Ht 5' 8 (1.727 m)   Wt 231 lb (104.8 kg)   SpO2 100%   BMI 35.12 kg/m   General:   Alert,  Well-developed, well-nourished, pleasant and cooperative in NAD Airway:   Mallampati 1 Lungs:  Clear throughout to auscultation.   Heart:  Regular rate and rhythm; no murmurs, clicks, rubs,  or gallops. Abdomen:  Soft, nontender and nondistended. Normal bowel sounds.   Neuro/Psych:  Normal mood and affect. A and O x 3   Nzinga Ferran E. Stacia, MD [facility]

## 2024-01-27 NOTE — Progress Notes (Signed)
 Sedate, gd SR, tolerated procedure well, VSS, report to RN

## 2024-01-27 NOTE — Op Note (Signed)
 [facility] Patient Name: Lance Warner Procedure Date: 01/27/2024 3:22 PM MRN: [id number] Endoscopist: Glendia E. Stacia , MD, [id number] Age: 53 Referring MD:  Date of Birth: June 03, 1971 Gender: Male Account #: [id number] Procedure:                Upper GI endoscopy Indications:              Epigastric abdominal pain, Esophageal reflux                            symptoms that persist despite appropriate therapy Medicines:                Monitored Anesthesia Care Procedure:                Pre-Anesthesia Assessment:                           - Prior to the procedure, a History and Physical                            was performed, and patient medications and                            allergies were reviewed. The patient's tolerance of                            previous anesthesia was also reviewed. The risks                            and benefits of the procedure and the sedation                            options and risks were discussed with the patient.                            All questions were answered, and informed consent                            was obtained. Prior Anticoagulants: The patient has                            taken no anticoagulant or antiplatelet agents. ASA                            Grade Assessment: II - A patient with mild systemic                            disease. After reviewing the risks and benefits,                            the patient was deemed in satisfactory condition to                            undergo the procedure.  After obtaining informed consent, the endoscope was                            passed under direct vision. Throughout the                            procedure, the patient's blood pressure, pulse, and                            oxygen saturations were monitored continuously. The                            Olympus Scope SN [id number] was introduced through the                             mouth, and advanced to the second part of duodenum.                            The upper GI endoscopy was accomplished without                            difficulty. The patient tolerated the procedure                            well. Scope In: Scope Out: Findings:                 The examined esophagus was normal.                           The entire examined stomach was normal. Biopsies                            were taken with a cold forceps for Helicobacter                            pylori testing.                           The examined duodenum was normal. Complications:            No immediate complications. Estimated Blood Loss:     Estimated blood loss was minimal. Impression:               - Normal esophagus. No evidence of reflux                            esophagitis or Barrett's esophagus.                           - Normal stomach. Biopsied.                           - Normal examined duodenum. Recommendation:           - Patient has a contact number available for  emergencies. The signs and symptoms of potential                            delayed complications were discussed with the                            patient. Return to normal activities tomorrow.                            Written discharge instructions were provided to the                            patient.                           - Resume previous diet.                           - Continue present medications.                           - Await pathology results. Lance Warner E. Stacia, MD 01/27/2024 4:25:38 PM This report has been signed electronically.

## 2024-01-27 NOTE — Patient Instructions (Addendum)
-  Handout on polyps provided -Await pathology results  YOU HAD AN ENDOSCOPIC PROCEDURE TODAY AT THE [facility]:   Refer to the procedure report that was given to you for any specific questions about what was found during the examination.  If the procedure report does not answer your questions, please call your gastroenterologist to clarify.  If you requested that your care partner not be given the details of your procedure findings, then the procedure report has been included in a sealed envelope for you to review at your convenience later.  YOU SHOULD EXPECT: Some feelings of bloating in the abdomen. Passage of more gas than usual.  Walking can help get rid of the air that was put into your GI tract during the procedure and reduce the bloating. If you had a lower endoscopy (such as a colonoscopy or flexible sigmoidoscopy) you may notice spotting of blood in your stool or on the toilet paper. If you underwent a bowel prep for your procedure, you may not have a normal bowel movement for a few days.  Please Note:  You might notice some irritation and congestion in your nose or some drainage.  This is from the oxygen used during your procedure.  There is no need for concern and it should clear up in a day or so.  SYMPTOMS TO REPORT IMMEDIATELY:  Following lower endoscopy (colonoscopy or flexible sigmoidoscopy):  Excessive amounts of blood in the stool  Significant tenderness or worsening of abdominal pains  Swelling of the abdomen that is new, acute  Fever of 100F or higher  Following upper endoscopy (EGD)  Vomiting of blood or coffee ground material  New chest pain or pain under the shoulder blades  Painful or persistently difficult swallowing  New shortness of breath  Fever of 100F or higher  Black, tarry-looking stools  For urgent or emergent issues, a gastroenterologist can be reached at any hour by calling [phone]. Do not use MyChart messaging for urgent concerns.     DIET:  We do recommend a small meal at first, but then you may proceed to your regular diet.  Drink plenty of fluids but you should avoid alcoholic beverages for 24 hours.  ACTIVITY:  You should plan to take it easy for the rest of today and you should NOT DRIVE or use heavy machinery until tomorrow (because of the sedation medicines used during the test).    FOLLOW UP: Our staff will call the number listed on your records the next business day following your procedure.  We will call around 7:15- 8:00 am to check on you and address any questions or concerns that you may have regarding the information given to you following your procedure. If we do not reach you, we will leave a message.     If any biopsies were taken you will be contacted by phone or by letter within the next 1-3 weeks.  Please call us  at [phone] if you have not heard about the biopsies in 3 weeks.    SIGNATURES/CONFIDENTIALITY: You and/or your care partner have signed paperwork which will be entered into your electronic medical record.  These signatures attest to the fact that that the information above on your After Visit Summary has been reviewed and is understood.  Full responsibility of the confidentiality of this discharge information lies with you and/or your care-partner.

## 2024-01-27 NOTE — Op Note (Signed)
 [facility] Patient Name: Lance Warner Procedure Date: 01/27/2024 3:13 PM MRN: [id number] Endoscopist: Glendia E. Stacia , MD, [id number] Age: 53 Referring MD:  Date of Birth: [birth year] Gender: Male Account #: [id number] Procedure:                Colonoscopy Indications:              Screening for colorectal malignant neoplasm, This                            is the patient's first colonoscopy Medicines:                Monitored Anesthesia Care Procedure:                Pre-Anesthesia Assessment:                           - Prior to the procedure, a History and Physical                            was performed, and patient medications and                            allergies were reviewed. The patient's tolerance of                            previous anesthesia was also reviewed. The risks                            and benefits of the procedure and the sedation                            options and risks were discussed with the patient.                            All questions were answered, and informed consent                            was obtained. Prior Anticoagulants: The patient has                            taken no anticoagulant or antiplatelet agents. ASA                            Grade Assessment: II - A patient with mild systemic                            disease. After reviewing the risks and benefits,                            the patient was deemed in satisfactory condition to                            undergo the procedure.  After obtaining informed consent, the colonoscope                            was passed under direct vision. Throughout the                            procedure, the patient's blood pressure, pulse, and                            oxygen saturations were monitored continuously. The                            CF [id number] #[id number] was introduced through the anus                            and advanced to  the the cecum, identified by                            appendiceal orifice and ileocecal valve. The                            colonoscopy was performed without difficulty. The                            patient tolerated the procedure well. The quality                            of the bowel preparation was adequate. The                            ileocecal valve, appendiceal orifice, and rectum                            were photographed. The bowel preparation used was                            SUPREP via split dose instruction. Scope In: 3:52:17 PM Scope Out: 4:16:27 PM Scope Withdrawal Time: 0 hours 18 minutes 58 seconds  Total Procedure Duration: 0 hours 24 minutes 10 seconds  Findings:                 The perianal and digital rectal examinations were                            normal. Pertinent negatives include normal                            sphincter tone and no palpable rectal lesions.                           A 9 mm polyp was found in the cecum. The polyp was                            sessile. The polyp was removed with  a cold snare.                            Resection and retrieval were complete. Estimated                            blood loss was minimal.                           A 2 mm polyp was found in the ileocecal valve. The                            polyp was sessile. The polyp was removed with a                            cold snare. Resection and retrieval were complete.                            Estimated blood loss was minimal.                           A 4 mm polyp was found in the ascending colon. The                            polyp was sessile. The polyp was removed with a                            cold snare. Resection and retrieval were complete.                            Estimated blood loss was minimal.                           A 4 mm polyp was found in the transverse colon. The                            polyp was sessile. The polyp was  removed with a                            cold snare. Resection and retrieval were complete.                            Estimated blood loss was minimal.                           A 5 mm polyp was found in the descending colon. The                            polyp was flat. The polyp was removed with a cold                            snare. Resection and retrieval were complete.  Estimated blood loss was minimal.                           A 2 mm polyp was found in the distal rectum. The                            polyp was sessile. The polyp was removed with a                            cold biopsy forceps. Resection and retrieval were                            complete. Estimated blood loss was minimal.                           A diffuse area of mildly vascular-pattern-decreased                            mucosa was found in the rectum and in the                            recto-sigmoid colon. Biopsies were taken with a                            cold forceps for histology. Biopsies were taken                            with a cold forceps for histology. Estimated blood                            loss was minimal.                           The exam was otherwise normal throughout the                            examined colon.                           The retroflexed view of the distal rectum and anal                            verge was normal and showed no anal or rectal                            abnormalities. Complications:            No immediate complications. Estimated Blood Loss:     Estimated blood loss was minimal. Impression:               - One 9 mm polyp in the cecum, removed with a cold                            snare. Resected and retrieved.                           -  One 2 mm polyp at the ileocecal valve, removed                            with a cold snare. Resected and retrieved.                           - One 4 mm polyp in the ascending  colon, removed                            with a cold snare. Resected and retrieved.                           - One 4 mm polyp in the transverse colon, removed                            with a cold snare. Resected and retrieved.                           - One 5 mm polyp in the descending colon, removed                            with a cold snare. Resected and retrieved.                           - One 2 mm polyp in the distal rectum, removed with                            a cold biopsy forceps. Resected and retrieved.                           - Vascular-pattern-decreased mucosa in the rectum                            and in the recto-sigmoid colon. Biopsied.                           - The distal rectum and anal verge are normal on                            retroflexion view. Recommendation:           - Patient has a contact number available for                            emergencies. The signs and symptoms of potential                            delayed complications were discussed with the                            patient. Return to normal activities tomorrow.  Written discharge instructions were provided to the                            patient.                           - Resume previous diet.                           - Continue present medications.                           - Await pathology results.                           - Repeat colonoscopy (date not yet determined) for                            surveillance based on pathology results. Nalany Steedley E. Stacia, MD 01/27/2024 4:33:23 PM This report has been signed electronically.

## 2024-01-27 NOTE — Progress Notes (Signed)
 Called to room to assist during endoscopic procedure.  Patient ID and intended procedure confirmed with present staff. Received instructions for my participation in the procedure from the performing physician.

## 2024-01-27 NOTE — Progress Notes (Signed)
 Pt's states no medical or surgical changes since previsit or office visit.

## 2024-01-28 ENCOUNTER — Telehealth: Payer: Self-pay

## 2024-01-28 NOTE — Telephone Encounter (Signed)
 No answer, left message to call if having any issues or concerns, B.Vale Mousseau RN

## 2024-01-29 LAB — SURGICAL PATHOLOGY

## 2024-02-02 ENCOUNTER — Ambulatory Visit: Payer: Self-pay | Admitting: Gastroenterology

## 2024-02-02 NOTE — Progress Notes (Signed)
 Mr. Lance Warner,  The biopsies of your stomach were normal.  There was no evidence of H. Pylori infection.  Four polyps that I removed during your colonoscopy were completely benign but were proven to be pre-cancerous polyps that MAY have grown into cancers if they had not been removed.  The other two polyps were not precancerous.  Studies shows that at least 20% of women over age 51 and 30% of men over age 33 have pre-cancerous polyps.  Based on current nationally recognized surveillance guidelines, I recommend that you have a repeat colonoscopy in 5 years.   The biopsies of the distal rectum showed evidence of a history of chronic inflammation, but no active inflammation.  Although ulcerative colitis is possible based on these results, I am not convinced you have this condition based on the endoscopic findings, your symptoms and the biopsies.   I would recommend you follow up with me in the office and we can discuss further evaluation, which may include a repeat examination in 6 months.  Team,  Please book a routine follow up office visit with me

## 2024-03-30 ENCOUNTER — Encounter: Payer: Self-pay | Admitting: Gastroenterology

## 2024-03-30 ENCOUNTER — Other Ambulatory Visit (INDEPENDENT_AMBULATORY_CARE_PROVIDER_SITE_OTHER)

## 2024-03-30 ENCOUNTER — Ambulatory Visit: Admitting: Gastroenterology

## 2024-03-30 VITALS — BP 132/74 | HR 84 | Ht 66.5 in | Wt 236.1 lb

## 2024-03-30 DIAGNOSIS — K760 Fatty (change of) liver, not elsewhere classified: Secondary | ICD-10-CM

## 2024-03-30 DIAGNOSIS — Z860100 Personal history of colon polyps, unspecified: Secondary | ICD-10-CM

## 2024-03-30 DIAGNOSIS — K529 Noninfective gastroenteritis and colitis, unspecified: Secondary | ICD-10-CM | POA: Diagnosis not present

## 2024-03-30 DIAGNOSIS — K7581 Nonalcoholic steatohepatitis (NASH): Secondary | ICD-10-CM

## 2024-03-30 DIAGNOSIS — K7400 Hepatic fibrosis, unspecified: Secondary | ICD-10-CM | POA: Diagnosis not present

## 2024-03-30 LAB — COMPREHENSIVE METABOLIC PANEL WITH GFR
ALT: 39 U/L (ref 0–53)
AST: 23 U/L (ref 0–37)
Albumin: 4.7 g/dL (ref 3.5–5.2)
Alkaline Phosphatase: 85 U/L (ref 39–117)
BUN: 17 mg/dL (ref 6–23)
CO2: 30 meq/L (ref 19–32)
Calcium: 9.8 mg/dL (ref 8.4–10.5)
Chloride: 101 meq/L (ref 96–112)
Creatinine, Ser: 1.29 mg/dL (ref 0.40–1.50)
GFR: 64.29 mL/min (ref >60.00)
Glucose, Bld: 141 mg/dL — ABNORMAL HIGH (ref 70–99)
Potassium: 3.8 meq/L (ref 3.5–5.1)
Sodium: 138 meq/L (ref 135–145)
Total Bilirubin: 0.5 mg/dL (ref 0.2–1.2)
Total Protein: 8.1 g/dL (ref 6.0–8.3)

## 2024-03-30 LAB — GAMMA GT: GGT: 69 U/L — ABNORMAL HIGH (ref 7–51)

## 2024-03-30 NOTE — Patient Instructions (Signed)
 Your provider has requested that you go to the basement level for lab work before leaving today. Press B on the elevator. The lab is located at the first door on the left as you exit the elevator.  We will contact you in June 2026 to schedule your ultrasound with elastography.   _______________________________________________________  If your blood pressure at your visit was 140/90 or greater, please contact your primary care physician to follow up on this.  _______________________________________________________  If you are age 65 or older, your body mass index should be between 23-30. Your Body mass index is 37.54 kg/m. If this is out of the aforementioned range listed, please consider follow up with your Primary Care Provider.  If you are age 31 or younger, your body mass index should be between 19-25. Your Body mass index is 37.54 kg/m. If this is out of the aformentioned range listed, please consider follow up with your Primary Care Provider.   ________________________________________________________  The [facility] providers would like to encourage you to use MYCHART to communicate with providers for non-urgent requests or questions.  Due to long hold times on the telephone, sending your provider a message by [facility] may be a faster and more efficient way to get a response.  Please allow 48 business hours for a response.  Please remember that this is for non-urgent requests.  _______________________________________________________  [facility] is using a team-based approach to care.  Your team is made up of your doctor and two to three APPS. Our APPS (Nurse Practitioners and Physician Assistants) work with your physician to ensure care continuity for you. They are fully qualified to address your health concerns and develop a treatment plan. They communicate directly with your gastroenterologist to care for you. Seeing the Advanced Practice Practitioners on your physician's  team can help you by facilitating care more promptly, often allowing for earlier appointments, access to diagnostic testing, procedures, and other specialty referrals.

## 2024-03-30 NOTE — Progress Notes (Unsigned)
 HPI :          FINAL DIAGNOSIS       1. Surgical [P], gastric :      - BENIGN GASTRIC MUCOSA WITH NO SPECIFIC PATHOLOGIC CHANGE.      - NO EVIDENCE OF H. PYLORI ON H&E STAIN.       2. Surgical [P], colon, cecum, ileocecal valve, ascending, transverse, polyp (4) :      - FRAGMENTS OF TUBULAR ADENOMAS.       3. Surgical [P], colon, descending and rectum, polyp (2) :      - BENIGN COLONIC MUCOSA WITH SURFACE HYPERPLASTIC CHANGES.       4. Surgical [P], colon, rectum/distal sigmoid :      - BENIGN COLONIC MUCOSA WITH MILD CHRONIC ARCHITECTURAL CHANGES.      - NEGATIVE FOR ACTIVITY OR DYSPLASIA.   Past Medical History:  Diagnosis Date   Adenomatous colon polyp    Allergy    GERD (gastroesophageal reflux disease)    Hyperlipidemia    Polyp of gallbladder    Vitamin D  deficiency      History reviewed. No pertinent surgical history. Family History  Problem Relation Age of Onset   Peripheral vascular disease Mother    Hypertension Mother    Vision loss Father    Cancer Sister    Migraines Sister        states died from aneyrysm   Colon cancer Neg Hx    Rectal cancer Neg Hx    Stomach cancer Neg Hx    Esophageal cancer Neg Hx    Social History   Tobacco Use   Smoking status: Never   Smokeless tobacco: Never  Vaping Use   Vaping status: Unknown  Substance Use Topics   Alcohol use: No   Drug use: No   Current Outpatient Medications  Medication Sig Dispense Refill   Cholecalciferol (VITAMIN D ) 2000 UNITS CAPS Take 2 capsules (4,000 Units total) by mouth daily. 60 capsule 11   fluticasone (FLONASE) 50 MCG/ACT nasal spray Place 1 spray into both nostrils daily.     meclizine (ANTIVERT) 25 MG tablet Take 25 mg by mouth as needed.     pantoprazole  (PROTONIX ) 40 MG tablet Take 1 tablet (40 mg total) by mouth daily. 30 tablet 0   No current facility-administered medications for this visit.   Allergies  Allergen Reactions   Pork-Derived Products Hives      Review of Systems: All systems reviewed and negative except where noted in HPI.    No results found.  Physical Exam: Ht 5' 6.5 (1.689 m) Comment: height measured without shoes  Wt 236 lb 2 oz (107.1 kg)   BMI 37.54 kg/m  Constitutional: Pleasant,well-developed, ***male in no acute distress. HEENT: Normocephalic and atraumatic. Conjunctivae are normal. No scleral icterus. Neck supple.  Cardiovascular: Normal rate, regular rhythm.  Pulmonary/chest: Effort normal and breath sounds normal. No wheezing, rales or rhonchi. Abdominal: Soft, nondistended, nontender. Bowel sounds active throughout. There are no masses palpable. No hepatomegaly. Extremities: no edema Lymphadenopathy: No cervical adenopathy noted. Neurological: Alert and oriented to person place and time. Skin: Skin is warm and dry. No rashes noted. Psychiatric: Normal mood and affect. Behavior is normal.  CBC    Component Value Date/Time   WBC 6.0 12/06/2023 1444   RBC 5.61 12/06/2023 1444   HGB 15.3 12/06/2023 1444   HCT 45.8 12/06/2023 1444   PLT 326 12/06/2023 1444   MCV 81.6 12/06/2023 1444   MCH  27.3 12/06/2023 1444   MCHC 33.4 12/06/2023 1444   RDW 16.1 (H) 12/06/2023 1444   LYMPHSABS 2.5 12/06/2023 1444   MONOABS 0.6 12/06/2023 1444   EOSABS 0.3 12/06/2023 1444   BASOSABS 0.0 12/06/2023 1444    CMP     Component Value Date/Time   NA 140 12/06/2023 1444   NA 141 07/19/2013 0929   K 3.7 12/06/2023 1444   CL 103 12/06/2023 1444   CO2 24 12/06/2023 1444   GLUCOSE 99 12/06/2023 1444   BUN 14 12/06/2023 1444   BUN 12 07/19/2013 0929   CREATININE 1.33 (H) 12/06/2023 1444   CALCIUM 9.2 12/06/2023 1444   PROT 7.4 12/06/2023 1444   PROT 7.3 07/05/2013 1011   ALBUMIN 4.5 12/06/2023 1444   ALBUMIN 4.7 07/05/2013 1011   AST 22 12/06/2023 1444   ALT 27 12/06/2023 1444   ALKPHOS 99 12/06/2023 1444   BILITOT 0.3 12/06/2023 1444   GFRNONAA >60 12/06/2023 1444   GFRAA >60 06/16/2019 2305        Latest Ref Rng & Units 12/06/2023    2:44 PM 09/09/2023    6:10 PM 09/05/2022    6:11 PM  CBC EXTENDED  WBC 4.0 - 10.5 K/uL 6.0  5.5  6.4   RBC 4.22 - 5.81 MIL/uL 5.61  5.61  5.64   Hemoglobin 13.0 - 17.0 g/dL 84.6  84.8  84.8   HCT 39.0 - 52.0 % 45.8  45.8  45.3   Platelets 150 - 400 K/uL 326  300  312   NEUT# 1.7 - 7.7 K/uL 2.6     Lymph# 0.7 - 4.0 K/uL 2.5         ASSESSMENT AND PLAN:  No ref. provider found

## 2024-04-06 LAB — ALPHA-1-ANTITRYPSIN: A-1 Antitrypsin, Ser: 129 mg/dL (ref 83–199)

## 2024-04-06 LAB — TISSUE TRANSGLUTAMINASE, IGA: (tTG) Ab, IgA: <1 U/mL

## 2024-04-06 LAB — HEPATITIS B SURFACE ANTIBODY,QUALITATIVE: Hep B S Ab: NONREACTIVE

## 2024-04-06 LAB — ANTI-SMOOTH MUSCLE ANTIBODY, IGG: Actin (Smooth Muscle) Antibody (IGG): <20 U (ref <20)

## 2024-04-06 LAB — MITOCHONDRIAL ANTIBODIES: Mitochondrial M2 Ab, IgG: <=20 U (ref <=20.0)

## 2024-04-06 LAB — HEPATITIS A ANTIBODY, TOTAL: Hepatitis A AB,Total: REACTIVE — AB

## 2024-04-06 LAB — IGG: IgG (Immunoglobin G), Serum: 1455 mg/dL (ref 600–1640)

## 2024-04-06 LAB — HEPATITIS B SURFACE ANTIGEN: Hepatitis B Surface Ag: NONREACTIVE

## 2024-04-06 LAB — HEPATITIS C ANTIBODY: Hepatitis C Ab: NONREACTIVE

## 2024-04-06 LAB — IGA: Immunoglobulin A: 155 mg/dL (ref 47–310)

## 2024-04-08 ENCOUNTER — Ambulatory Visit: Payer: Self-pay | Admitting: Gastroenterology

## 2024-04-08 NOTE — Progress Notes (Signed)
 Lance Warner,  All of the tests looking for other causes of chronic liver disease were negative/normal.  This makes metabolic dysfunction associated steatotic liver disease (MASLD) the most likely cause of your fatty liver and elevated liver enzymes.  Please focus on the dietary changes we discussed.  Will plan to repeat elastography in 1 year.  Your labs indicated that you are not immune to hepatitis B.  Patient's with chronic liver disease are recommended to get vaccinated against hepatitis B.  We can provide this vaccine for you if you like.

## 2024-04-16 ENCOUNTER — Ambulatory Visit

## 2024-04-26 ENCOUNTER — Ambulatory Visit
